# Patient Record
Sex: Male | Born: 1975 | Race: Black or African American | Hispanic: No | Marital: Single | Smoking: Current every day smoker
Health system: Southern US, Community
[De-identification: ages and names within clinical notes are randomized; demographics above are authoritative.]

## PROBLEM LIST (undated history)

## (undated) DIAGNOSIS — F209 Schizophrenia, unspecified: Secondary | ICD-10-CM

## (undated) DIAGNOSIS — F319 Bipolar disorder, unspecified: Secondary | ICD-10-CM

## (undated) HISTORY — PX: PARTIAL HIP ARTHROPLASTY: SHX733

## (undated) HISTORY — PX: APPENDECTOMY: SHX54

---

## 1999-04-16 ENCOUNTER — Inpatient Hospital Stay (HOSPITAL_COMMUNITY): Admission: EM | Admit: 1999-04-16 | Discharge: 1999-04-18 | Payer: Self-pay | Admitting: Emergency Medicine

## 1999-04-16 ENCOUNTER — Encounter: Payer: Self-pay | Admitting: Emergency Medicine

## 1999-09-09 ENCOUNTER — Emergency Department (HOSPITAL_COMMUNITY): Admission: EM | Admit: 1999-09-09 | Discharge: 1999-09-09 | Payer: Self-pay | Admitting: *Deleted

## 1999-09-11 ENCOUNTER — Emergency Department (HOSPITAL_COMMUNITY): Admission: EM | Admit: 1999-09-11 | Discharge: 1999-09-11 | Payer: Self-pay | Admitting: Emergency Medicine

## 1999-09-17 ENCOUNTER — Emergency Department (HOSPITAL_COMMUNITY): Admission: EM | Admit: 1999-09-17 | Discharge: 1999-09-17 | Payer: Self-pay | Admitting: Emergency Medicine

## 2000-04-23 ENCOUNTER — Inpatient Hospital Stay (HOSPITAL_COMMUNITY): Admission: EM | Admit: 2000-04-23 | Discharge: 2000-04-28 | Payer: Self-pay | Admitting: Emergency Medicine

## 2000-04-23 ENCOUNTER — Encounter: Payer: Self-pay | Admitting: General Surgery

## 2001-04-29 ENCOUNTER — Encounter: Payer: Self-pay | Admitting: Emergency Medicine

## 2001-04-29 ENCOUNTER — Emergency Department (HOSPITAL_COMMUNITY): Admission: EM | Admit: 2001-04-29 | Discharge: 2001-04-29 | Payer: Self-pay | Admitting: Emergency Medicine

## 2002-01-11 ENCOUNTER — Emergency Department (HOSPITAL_COMMUNITY): Admission: EM | Admit: 2002-01-11 | Discharge: 2002-01-11 | Payer: Self-pay | Admitting: Emergency Medicine

## 2002-01-11 ENCOUNTER — Encounter: Payer: Self-pay | Admitting: Emergency Medicine

## 2002-06-25 ENCOUNTER — Encounter: Payer: Self-pay | Admitting: Internal Medicine

## 2002-06-25 ENCOUNTER — Ambulatory Visit (HOSPITAL_COMMUNITY): Admission: RE | Admit: 2002-06-25 | Discharge: 2002-06-25 | Payer: Self-pay | Admitting: Internal Medicine

## 2003-01-19 ENCOUNTER — Encounter: Payer: Self-pay | Admitting: Emergency Medicine

## 2003-01-19 ENCOUNTER — Emergency Department (HOSPITAL_COMMUNITY): Admission: AC | Admit: 2003-01-19 | Discharge: 2003-01-19 | Payer: Self-pay | Admitting: Emergency Medicine

## 2009-12-02 ENCOUNTER — Emergency Department (HOSPITAL_COMMUNITY): Admission: EM | Admit: 2009-12-02 | Discharge: 2009-12-02 | Payer: Self-pay | Admitting: Family Medicine

## 2009-12-06 ENCOUNTER — Emergency Department (HOSPITAL_COMMUNITY): Admission: EM | Admit: 2009-12-06 | Discharge: 2009-12-06 | Payer: Self-pay | Admitting: Family Medicine

## 2010-02-26 ENCOUNTER — Emergency Department (HOSPITAL_COMMUNITY): Admission: EM | Admit: 2010-02-26 | Discharge: 2010-02-27 | Payer: Self-pay | Admitting: Emergency Medicine

## 2010-06-16 ENCOUNTER — Emergency Department (HOSPITAL_COMMUNITY)
Admission: EM | Admit: 2010-06-16 | Discharge: 2010-06-16 | Payer: Self-pay | Source: Home / Self Care | Admitting: Family Medicine

## 2010-09-30 LAB — COMPREHENSIVE METABOLIC PANEL
ALT: 22 U/L (ref 0–53)
AST: 23 U/L (ref 0–37)
Albumin: 4.4 g/dL (ref 3.5–5.2)
CO2: 30 mEq/L (ref 19–32)
Calcium: 8.9 mg/dL (ref 8.4–10.5)
Chloride: 109 mEq/L (ref 96–112)
GFR calc Af Amer: >60 mL/min (ref >60)
GFR calc non Af Amer: >60 mL/min (ref >60)
Sodium: 145 mEq/L (ref 135–145)
Total Bilirubin: 0.2 mg/dL — ABNORMAL LOW (ref 0.3–1.2)

## 2010-09-30 LAB — CBC
HCT: 47.8 % (ref 39.0–52.0)
Hemoglobin: 16.1 g/dL (ref 13.0–17.0)
MCH: 30.8 pg (ref 26.0–34.0)
MCHC: 33.7 g/dL (ref 30.0–36.0)
MCV: 91.6 fL (ref 78.0–100.0)
Platelets: 296 10*3/uL (ref 150–400)
RBC: 5.22 MIL/uL (ref 4.22–5.81)
RDW: 16.6 % — ABNORMAL HIGH (ref 11.5–15.5)
WBC: 11 10*3/uL — ABNORMAL HIGH (ref 4.0–10.5)

## 2010-09-30 LAB — RAPID URINE DRUG SCREEN, HOSP PERFORMED
Amphetamines: NOT DETECTED
Barbiturates: NOT DETECTED
Benzodiazepines: NOT DETECTED
Cocaine: POSITIVE — AB
Opiates: NOT DETECTED
Tetrahydrocannabinol: NOT DETECTED

## 2010-09-30 LAB — DIFFERENTIAL
Eosinophils Absolute: 0 10*3/uL (ref 0.0–0.7)
Eosinophils Relative: 0 % (ref 0–5)
Lymphs Abs: 2.8 10*3/uL (ref 0.7–4.0)
Monocytes Absolute: 0.6 10*3/uL (ref 0.1–1.0)

## 2010-09-30 LAB — ETHANOL: Alcohol, Ethyl (B): 303 mg/dL — ABNORMAL HIGH (ref 0–10)

## 2010-10-03 LAB — CULTURE, ROUTINE-ABSCESS
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
## 2011-10-02 ENCOUNTER — Emergency Department (INDEPENDENT_AMBULATORY_CARE_PROVIDER_SITE_OTHER)
Admission: EM | Admit: 2011-10-02 | Discharge: 2011-10-02 | Disposition: A | Discharge status: Home / Self Care | Payer: Self-pay | Source: Home / Self Care | Attending: Family Medicine | Admitting: Family Medicine

## 2011-10-02 ENCOUNTER — Encounter (HOSPITAL_COMMUNITY): Payer: Self-pay

## 2011-10-02 DIAGNOSIS — S7011XA Contusion of right thigh, initial encounter: Secondary | ICD-10-CM

## 2011-10-02 DIAGNOSIS — S7010XA Contusion of unspecified thigh, initial encounter: Secondary | ICD-10-CM
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
## 2011-10-02 NOTE — Discharge Instructions (Signed)
Wear ace for comfort, use heat and stretch for sore tight thigh muscle, activity as tolerated, see orthopedist if further problems.
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
## 2012-05-15 ENCOUNTER — Emergency Department (HOSPITAL_COMMUNITY)
Admission: EM | Admit: 2012-05-15 | Discharge: 2012-05-16 | Disposition: A | Discharge status: Home / Self Care | Payer: Self-pay | Attending: Emergency Medicine | Admitting: Emergency Medicine

## 2012-05-15 ENCOUNTER — Encounter (HOSPITAL_COMMUNITY): Payer: Self-pay | Admitting: *Deleted

## 2012-05-15 DIAGNOSIS — F1910 Other psychoactive substance abuse, uncomplicated: Secondary | ICD-10-CM | POA: Insufficient documentation

## 2012-05-15 DIAGNOSIS — R45851 Suicidal ideations: Secondary | ICD-10-CM | POA: Insufficient documentation

## 2012-05-15 DIAGNOSIS — F17200 Nicotine dependence, unspecified, uncomplicated: Secondary | ICD-10-CM | POA: Insufficient documentation

## 2012-05-15 LAB — CBC WITH DIFFERENTIAL/PLATELET
Eosinophils Absolute: 0.1 10*3/uL (ref 0.0–0.7)
Hemoglobin: 15.1 g/dL (ref 13.0–17.0)
Lymphocytes Relative: 30 % (ref 12–46)
Lymphs Abs: 2.4 10*3/uL (ref 0.7–4.0)
MCH: 29.4 pg (ref 26.0–34.0)
Monocytes Relative: 7 % (ref 3–12)
Neutro Abs: 4.8 10*3/uL (ref 1.7–7.7)
Neutrophils Relative %: 61 % (ref 43–77)
Platelets: 324 10*3/uL (ref 150–400)
RBC: 5.13 MIL/uL (ref 4.22–5.81)
WBC: 7.8 10*3/uL (ref 4.0–10.5)

## 2012-05-15 LAB — COMPREHENSIVE METABOLIC PANEL
ALT: 65 U/L — ABNORMAL HIGH (ref 0–53)
Alkaline Phosphatase: 77 U/L (ref 39–117)
BUN: 9 mg/dL (ref 6–23)
CO2: 21 mEq/L (ref 19–32)
Chloride: 102 mEq/L (ref 96–112)
GFR calc Af Amer: 84 mL/min — ABNORMAL LOW (ref >90)
GFR calc non Af Amer: 73 mL/min — ABNORMAL LOW (ref >90)
Glucose, Bld: 104 mg/dL — ABNORMAL HIGH (ref 70–99)
Potassium: 3.7 mEq/L (ref 3.5–5.1)
Sodium: 138 mEq/L (ref 135–145)
Total Bilirubin: 0.2 mg/dL — ABNORMAL LOW (ref 0.3–1.2)
Total Protein: 7.9 g/dL (ref 6.0–8.3)

## 2012-05-15 LAB — URINALYSIS, ROUTINE W REFLEX MICROSCOPIC
Glucose, UA: NEGATIVE mg/dL
Ketones, ur: NEGATIVE mg/dL
Leukocytes, UA: NEGATIVE
Nitrite: NEGATIVE
Protein, ur: 30 mg/dL — AB
pH: 5 (ref 5.0–8.0)

## 2012-05-15 LAB — RAPID URINE DRUG SCREEN, HOSP PERFORMED
Amphetamines: NOT DETECTED
Opiates: NOT DETECTED

## 2012-05-15 LAB — URINE MICROSCOPIC-ADD ON

## 2012-05-15 LAB — ETHANOL: Alcohol, Ethyl (B): 210 mg/dL — ABNORMAL HIGH (ref 0–11)

## 2012-05-15 MED ORDER — LORAZEPAM 1 MG PO TABS
1.0000 mg | ORAL_TABLET | Freq: Three times a day (TID) | ORAL | Status: DC | PRN
  Administered 2012-05-15: 1 mg via ORAL
  Filled 2012-05-15: qty 1

## 2012-05-15 MED ORDER — ZIPRASIDONE MESYLATE 20 MG IM SOLR
10.0000 mg | Freq: Once | INTRAMUSCULAR | Status: AC
  Administered 2012-05-15: 10 mg via INTRAMUSCULAR
  Filled 2012-05-15: qty 20

## 2012-05-15 NOTE — ED Notes (Signed)
Attempted to draw patients blood. Patient stated "That ain't going down." Explained to patient that it was a doctor order. Patient still refused. This Clinical research associate suggested maybe I could come back later. Patient agreed to that.

## 2012-05-15 NOTE — ED Notes (Signed)
Resting in bed and watching TV on approach. Appears flat and depressed, somewhat anxious. States he no longer feels suicidal and wants to leave. Explained he would have to talk to the psychiatrist first and let him determine the plan of care. Did request prn for anxiety and a prn was provided. Denies HI/AVH and contracts for safety. Offered no additional questions or concerns.
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
## 2012-05-15 NOTE — BH Assessment (Signed)
Assessment Note   Antonio Floyd is an 36 y.o. male who presents to [facility] voluntarily stating he feels like he is "about to break." He states he had a recent falling out with his uncle and mother over his grandmother's house. He states he has been living in her home but was recently kicked out by his uncle who now owns the home. He states he went to his mother for support but she refused to help him. He reports he has additional finical and occupational related stressors at this time. He states that he believes he is having depressive symptoms including irritability and anger. He states when he becomes depressed he wants to hurt other people. He denies any HI intent, plan, or intended victim at this time. He denies SI and [facility].   He reports using alcohol "a few" times a week and states he drinks "a lot" when he drinks. He reports last use was this morning. Pt also reports using cocaine infrequently, stating he will not actively seek it out or pay for it but will use when someone offers him some. He also reports using a quarter gram of THC daily. Pt states he can contract for safety and would like resources for psychiatrists.  A telepsych has been ordered to assist with disposition.      Axis I: Mood Disorder NOS and Substance Abuse Axis II: Deferred Axis III: History reviewed. No pertinent past medical history. Axis IV: housing problems, other psychosocial or environmental problems and problems related to social environment Axis V: 31-40 impairment in reality testing  Past Medical History: History reviewed. No pertinent past medical history.  History reviewed. No pertinent past surgical history.  Family History: No family history on file.  Social History:  reports that he has been smoking Cigarettes.  He has been smoking about .5 packs per day. He does not have any smokeless tobacco history on file. He reports that he drinks alcohol. He reports that he uses illicit drugs  (Marijuana).  Additional Social History:  Alcohol / Drug Use History of alcohol / drug use?: Yes Substance #1 Name of Substance 1: Alcohol 1 - Age of First Use: teens 1 - Amount (size/oz): states he drinks "a lot" when he drinks 1 - Frequency: 2-3 times weekly 1 - Duration: 3 months, on and off since 20s  1 - Last Use / Amount: 05/15/12 Substance #2 Name of Substance 2: cocaine 2 - Age of First Use: teens 2 - Amount (size/oz): varries, states he uses when someone offers him some but will not actively seek it or pay for it 2 - Last Use / Amount: 05/14/12 Substance #3 Name of Substance 3: THC 3 - Age of First Use: 16 3 - Amount (size/oz): a quater of a gram 3 - Frequency: daily 3 - Duration: since age 53 3 - Last Use / Amount: 05/15/12  CIWA: CIWA-Ar BP: 151/85 mmHg Pulse Rate: 85  COWS:    Allergies: No Known Allergies  Home Medications:  (Not in a hospital admission)  OB/GYN Status:  No LMP for male patient.  General Assessment Data Location of Assessment: [facility] Living Arrangements: Alone Can pt return to current living arrangement?: Yes Admission Status: Voluntary Is patient capable of signing voluntary admission?: Yes Transfer from: Acute Hospital Referral Source: Self/Family/Friend  Education Status Is patient currently in school?: No  Risk to self Suicidal Ideation: No Suicidal Intent: No Is patient at risk for suicide?: No Suicidal Plan?: No Access to Means: No What  has been your use of drugs/alcohol within the last 12 months?: THC, Cocaine, and alcohol Previous Attempts/Gestures: Yes How many times?: 1  Other Self Harm Risks: drug use Triggers for Past Attempts: Other personal contacts;Family contact Intentional Self Injurious Behavior: None Family Suicide History: No Recent stressful life event(s): Conflict (Comment) (conflict with family  ) Persecutory voices/beliefs?: No Depression: Yes Depression Symptoms: Feeling angry/irritable Substance  abuse history and/or treatment for substance abuse?: Yes Suicide prevention information given to non-admitted patients: Not applicable  Risk to Others Homicidal Ideation: No-Not Currently/Within Last 6 Months Thoughts of Harm to Others: No-Not Currently Present/Within Last 6 Months Current Homicidal Intent: No Current Homicidal Plan: No Access to Homicidal Means: No Identified Victim: none History of harm to others?: Yes Assessment of Violence: None Noted Violent Behavior Description: cooperative Does patient have access to weapons?: No Criminal Charges Pending?: No Does patient have a court date: No  Psychosis Hallucinations: None noted Delusions: None noted  Mental Status Report Appear/Hygiene: Disheveled Eye Contact: Fair Motor Activity: Unremarkable Speech: Logical/coherent Level of Consciousness: Alert Mood: Irritable Affect: Irritable Anxiety Level: None Thought Processes: Coherent;Relevant Judgement: Impaired Orientation: Person;Time;Place;Situation Obsessive Compulsive Thoughts/Behaviors: None  Cognitive Functioning Concentration: Normal Memory: Recent Intact;Remote Intact IQ: Average Insight: Poor Impulse Control: Poor Appetite: Fair Weight Loss: 0  Weight Gain: 0  Sleep: No Change Vegetative Symptoms: None  ADLScreening [facility] Assessment Services) Patient's cognitive ability adequate to safely complete daily activities?: Yes Patient able to express need for assistance with ADLs?: Yes Independently performs ADLs?: Yes (appropriate for developmental age)  Abuse/Neglect [facility]) Physical Abuse: Denies Verbal Abuse: Yes, past (Comment) Sexual Abuse: Denies  Prior Inpatient Therapy Prior Inpatient Therapy: No  Prior Outpatient Therapy Prior Outpatient Therapy: No Prior Therapy Dates: na Prior Therapy Facilty/Provider(s): na Reason for Treatment: na  ADL Screening (condition at time of admission) Patient's cognitive ability adequate to safely complete  daily activities?: Yes Patient able to express need for assistance with ADLs?: Yes Independently performs ADLs?: Yes (appropriate for developmental age) Weakness of Legs: None Weakness of Arms/Hands: None  Home Assistive Devices/Equipment Home Assistive Devices/Equipment: None    Abuse/Neglect Assessment (Assessment to be complete while patient is alone) Physical Abuse: Denies Verbal Abuse: Yes, past (Comment) Sexual Abuse: Denies Exploitation of patient/patient's resources: Denies Self-Neglect: Denies Values / Beliefs Cultural Requests During Hospitalization: None Spiritual Requests During Hospitalization: None   Advance Directives (For Healthcare) Advance Directive: Patient does not have advance directive;Patient would not like information Pre-existing out of facility DNR order (yellow form or pink MOST form): No Nutrition Screen- [facility] Patient's home diet: Regular Have you recently lost weight without trying?: No Have you been eating poorly because of a decreased appetite?: No Malnutrition Screening Tool Score: 0   Additional Information 1:1 In Past 12 Months?: No CIRT Risk: No Elopement Risk: No Does patient have medical clearance?: Yes     Disposition:  Disposition Disposition of Patient: Other dispositions (Pending telepsych)  On Site Evaluation by:   Reviewed with Physician:     Georgina Quint A 05/15/2012 9:07 PM

## 2012-05-16 NOTE — ED Provider Notes (Signed)
Patient was evaluated by psychiatry. His consultation note was reviewed. Recommending discharge. Patient is now calm and cooperative. He has no evidence of psychosis. Initial aggression likely secondary to cocaine ingestion. The patient is psychiatrically stable for discharge at this time. Outpatient followup.  Raeford Razor, MD 05/16/12 [phone]

## 2012-08-03 ENCOUNTER — Encounter (HOSPITAL_COMMUNITY): Payer: Self-pay | Admitting: Emergency Medicine

## 2012-08-03 ENCOUNTER — Emergency Department (HOSPITAL_COMMUNITY)
Admission: EM | Admit: 2012-08-03 | Discharge: 2012-08-04 | Disposition: A | Discharge status: Home / Self Care | Payer: Self-pay | Attending: Emergency Medicine | Admitting: Emergency Medicine

## 2012-08-03 DIAGNOSIS — F39 Unspecified mood [affective] disorder: Secondary | ICD-10-CM | POA: Insufficient documentation

## 2012-08-03 DIAGNOSIS — F411 Generalized anxiety disorder: Secondary | ICD-10-CM | POA: Insufficient documentation

## 2012-08-03 DIAGNOSIS — F209 Schizophrenia, unspecified: Secondary | ICD-10-CM | POA: Insufficient documentation

## 2012-08-03 DIAGNOSIS — F1910 Other psychoactive substance abuse, uncomplicated: Secondary | ICD-10-CM | POA: Insufficient documentation

## 2012-08-03 DIAGNOSIS — F602 Antisocial personality disorder: Secondary | ICD-10-CM | POA: Insufficient documentation

## 2012-08-03 DIAGNOSIS — F17200 Nicotine dependence, unspecified, uncomplicated: Secondary | ICD-10-CM | POA: Insufficient documentation

## 2012-08-03 DIAGNOSIS — F1210 Cannabis abuse, uncomplicated: Secondary | ICD-10-CM | POA: Insufficient documentation

## 2012-08-03 DIAGNOSIS — F319 Bipolar disorder, unspecified: Secondary | ICD-10-CM | POA: Insufficient documentation

## 2012-08-03 DIAGNOSIS — R45851 Suicidal ideations: Secondary | ICD-10-CM | POA: Insufficient documentation

## 2012-08-03 LAB — COMPREHENSIVE METABOLIC PANEL
ALT: 20 U/L (ref 0–53)
AST: 24 U/L (ref 0–37)
Albumin: 4.1 g/dL (ref 3.5–5.2)
Calcium: 9.6 mg/dL (ref 8.4–10.5)
Creatinine, Ser: 1.09 mg/dL (ref 0.50–1.35)
Sodium: 145 mEq/L (ref 135–145)

## 2012-08-03 LAB — CBC
MCH: 30.9 pg (ref 26.0–34.0)
MCV: 89.2 fL (ref 78.0–100.0)
Platelets: 337 10*3/uL (ref 150–400)
RBC: 4.92 MIL/uL (ref 4.22–5.81)
RDW: 15.2 % (ref 11.5–15.5)
WBC: 11.2 10*3/uL — ABNORMAL HIGH (ref 4.0–10.5)

## 2012-08-03 LAB — RAPID URINE DRUG SCREEN, HOSP PERFORMED
Amphetamines: NOT DETECTED
Barbiturates: NOT DETECTED
Benzodiazepines: POSITIVE — AB
Cocaine: NOT DETECTED
Opiates: NOT DETECTED
Tetrahydrocannabinol: POSITIVE — AB

## 2012-08-03 MED ORDER — NICOTINE 21 MG/24HR TD PT24: 21.0000 mg | MEDICATED_PATCH | Freq: Every day | TRANSDERMAL | Status: DC

## 2012-08-03 MED ORDER — ZOLPIDEM TARTRATE 5 MG PO TABS: 5.0000 mg | ORAL_TABLET | Freq: Every evening | ORAL | Status: DC | PRN

## 2012-08-03 MED ORDER — ONDANSETRON HCL 4 MG PO TABS: 4.0000 mg | ORAL_TABLET | Freq: Three times a day (TID) | ORAL | Status: DC | PRN

## 2012-08-03 MED ORDER — ACETAMINOPHEN 325 MG PO TABS: 650.0000 mg | ORAL_TABLET | ORAL | Status: DC | PRN

## 2012-08-03 MED ORDER — IBUPROFEN 600 MG PO TABS: 600.0000 mg | ORAL_TABLET | Freq: Three times a day (TID) | ORAL | Status: DC | PRN

## 2012-08-03 NOTE — ED Provider Notes (Signed)
Medical screening examination/treatment/procedure(s) were performed by non-physician practitioner and as supervising physician I was immediately available for consultation/collaboration.  Leandrew Keech R. Gerritt Galentine, MD 08/03/12 2328 

## 2012-08-03 NOTE — ED Notes (Signed)
[employer] sts pt called and sts he was about to hurt himself and others. Pt sts he has been seen for same thing in past.

## 2012-08-03 NOTE — ED Provider Notes (Signed)
History  Scribed for Antonio Pel, PA-C/Nathan R. Rubin Payor, MD, the patient was seen in room [facility]TR1/[facility]. This chart was scribed by Candelaria Stagers. The patient's care started at 10:40 PM   CSN: [id number]  Arrival date & time 08/03/12  2219   First MD Initiated Contact with Patient 08/03/12 2238      Chief Complaint  Patient presents with  . Medical Clearance     The history is provided by the patient and the police. The history is limited by the condition of the patient. No language interpreter was used.   Pt status: currently voluntary  Takumi GAYLON BENTZ is a 37 y.o. male who presents to the Emergency Department after calling the police earlier today because he was worried about hurting himself or someone else.  Pt has been seen for the same thing in the past.  H/o bipolar and schizophrenia.   Pt is not cooperative and will not answer questions. He is very anxious. He becomes very suspicious when offered medication to help him calm down.  History reviewed. No pertinent past medical history.  History reviewed. No pertinent past surgical history.  No family history on file.  History  Substance Use Topics  . Smoking status: Current Every Day Smoker -- 0.5 packs/day    Types: Cigarettes  . Smokeless tobacco: Not on file  . Alcohol Use: Yes      Review of Systems  Unable to perform ROS: Other  Psychiatric/Behavioral: Positive for suicidal ideas.  pt uncooperative and anxious  Allergies  Review of patient's allergies indicates no known allergies.  Home Medications  No current outpatient prescriptions on file.  BP 146/96  Pulse 104  Temp 98.3 F (36.8 C) (Oral)  Resp 20  SpO2 97%  Physical Exam  Nursing note and vitals reviewed. Constitutional: He is oriented to person, place, and time. He appears well-developed and well-nourished. No distress.  HENT:  Head: Normocephalic and atraumatic.  Eyes: EOM are normal.  Neck: Neck supple. No tracheal deviation  present.  Cardiovascular: Normal rate.   Pulmonary/Chest: Effort normal. No respiratory distress.  Musculoskeletal: Normal range of motion.  Neurological: He is alert and oriented to person, place, and time.  Skin: Skin is warm and dry.  Psychiatric: His mood appears anxious. His affect is angry. He expresses suicidal ideation.       Vague about homicidal ideation Will not disclose if he has plan    ED Course  Procedures  DIAGNOSTIC STUDIES: Oxygen Saturation is 97% on room air, normal by my interpretation.    COORDINATION OF CARE:   10:21 PM Ordered: CBC; Comprehensive metabolic panel; Ethanol (ETOH); Urine Drug Screen 10:46 PM Consult to call ACT team Labs Reviewed  CBC - Abnormal; Notable for the following:    WBC 11.2 (*)     All other components within normal limits  COMPREHENSIVE METABOLIC PANEL  ETHANOL  URINE RAPID DRUG SCREEN (HOSP PERFORMED)   No results found.   1. Suicidal ideation   2. Schizophrenia   3. Bipolar disorder       MDM  ACT consulted. Holding orders placed. Med red done  Pt was becoming increasingly anxious but calmed down after police officers left without sedation. He is voluntary. If he continues to endorse SI he would need to be IVC'd.   I personally performed the services described in this documentation, which was scribed in my presence. The recorded information has been reviewed and is accurate.        Rashan Rounsaville G  Neva Seat, [place] 08/03/12 2309

## 2012-08-04 NOTE — BH Assessment (Signed)
Assessment Note   Antonio Floyd is an 37 y.o. male.Pt presents voluntarily to [facility] via [employer]. Pt sts he called [employer] b/c "I felt like I was going to spazz out". Pt refuses to define what that term means to him. Pt denies wanting to harm himself. Pt won't answer when asked re: HI or wanting to harm another person. Per ED notes, pt told RN that he wanted to kill someone. Pt vague and doesn't answer several questions. Pt's BAL was 155 upon admission. Pt lives with his mother. When writer asked pt re: his mood, pt says "I don't know". Pt answers "I don't know" to several questions and refused to elaborate. His affect is irritable and he is drowsy. He states he drank "one pint of alcohol" 08/03/12.  Pt denies [facility] and no delusions noted. Pt denies prior inpatient or outpatient MH treatment. Pt's disposition is pending telepsych consult. Per pt's PTA meds list, he takes Seroquel.  Axis I: Mood Disorder NOS Axis II: Deferred Axis III: History reviewed. No pertinent past medical history. Axis IV: other psychosocial or environmental problems and problems related to social environment Axis V: 31-40 impairment in reality testing  Past Medical History: History reviewed. No pertinent past medical history.  History reviewed. No pertinent past surgical history.  Family History: No family history on file.  Social History:  reports that he has been smoking Cigarettes.  He has been smoking about .5 packs per day. He does not have any smokeless tobacco history on file. He reports that he drinks alcohol. He reports that he uses illicit drugs (Marijuana).  Additional Social History:  Alcohol / Drug Use Pain Medications: none Prescriptions: see PTA meds Over the Counter: none History of alcohol / drug use?: Yes Substance #1 Name of Substance 1: alcohol 1 - Age of First Use: teens 1 - Amount (size/oz): varies 1 - Frequency: 2-3 times weekly 1 - Duration: on and off since 20s 1 - Last Use / Amount: 08/03/12 -  one pint Substance #2 Name of Substance 2: THC 2 - Age of First Use: 16 2 - Amount (size/oz): one quarter of a gram 2 - Frequency: daily 2 - Duration: since age 71 2 - Last Use / Amount: "the other day" - one blunt  CIWA: CIWA-Ar BP: 127/77 mmHg Pulse Rate: 92  Nausea and Vomiting: no nausea and no vomiting Tactile Disturbances: none Tremor: no tremor Auditory Disturbances: not present Paroxysmal Sweats: no sweat visible Visual Disturbances: not present Anxiety: two Headache, Fullness in Head: none present Agitation: normal activity Orientation and Clouding of Sensorium: oriented and can do serial additions CIWA-Ar Total: 2  COWS:    Allergies: No Known Allergies  Home Medications:  (Not in a hospital admission)  OB/GYN Status:  No LMP for male patient.  General Assessment Data [facility] of Assessment: [facility] Living Arrangements: Parent Can pt return to current living arrangement?: Yes Admission Status: Voluntary Is patient capable of signing voluntary admission?: Yes Transfer from: Acute Hospital Referral Source: Self/Family/Friend ([employer])  Education Status Is patient currently in school?: No Current Grade: none Highest grade of school patient has completed:  (pt says he doesn't know highest grade attended)  Risk to self Suicidal Ideation: No Suicidal Intent: No Is patient at risk for suicide?: No Suicidal Plan?: No Access to Means: No What has been your use of drugs/alcohol within the last 12 months?: frequent alcohol and thc use Previous Attempts/Gestures: Yes How many times?: 1  (3 yrs ago tried to cut  his neck) Other Self Harm Risks: none Triggers for Past Attempts: Family contact Intentional Self Injurious Behavior: None Family Suicide History: Unable to assess Recent stressful life event(s):  (pt denies stressors) Persecutory voices/beliefs?: No Depression: No Substance abuse history and/or treatment for substance abuse?: No Suicide prevention  information given to non-admitted patients: Not applicable  Risk to Others Homicidal Ideation:  (pt wont answer) Thoughts of Harm to Others:  (pt won't answer but told RN earlier that he wanted to harm ) Current Homicidal Intent:  (unable to assess as pt won't answer) Current Homicidal Plan:  (unable to assess as pt won't answer) Access to Homicidal Means:  (unable to assess) Identified Victim:  (unable to assess) History of harm to others?: Yes Assessment of Violence: In distant past Violent Behavior Description: 3 yrs ago pt arrested for attempted murder Does patient have access to weapons?: No (pt denies) Criminal Charges Pending?: No Does patient have a court date: No  Psychosis Hallucinations: None noted (pt denies) Delusions: None noted  Mental Status Report Appear/Hygiene: Other (Comment) (unremarkable) Eye Contact: Fair Motor Activity: Freedom of movement Speech: Logical/coherent Level of Consciousness: Drowsy Mood: Other (Comment) (pt won't answer question) Affect: Irritable Anxiety Level: Minimal Thought Processes: Coherent;Relevant Judgement: Impaired Orientation: Person;Situation;Time;Place Obsessive Compulsive Thoughts/Behaviors:  (unable to assess)  Cognitive Functioning Concentration: Normal Memory:  (unable to assess) IQ: Average Insight: Poor Impulse Control: Fair Appetite:  (unable to assess) Sleep:  (unable to assess) Vegetative Symptoms: None  ADLScreening [facility] Assessment Services) Patient's cognitive ability adequate to safely complete daily activities?: Yes Patient able to express need for assistance with ADLs?: Yes Independently performs ADLs?: Yes (appropriate for developmental age)  Abuse/Neglect [facility]) Physical Abuse:  (unable to assess) Verbal Abuse:  (unable to assess) Sexual Abuse:  (unable to assess)  Prior Inpatient Therapy Prior Inpatient Therapy: No Prior Therapy Dates: na Prior Therapy Facilty/Provider(s): na Reason for  Treatment: na  Prior Outpatient Therapy Prior Outpatient Therapy: No (pt denies) Prior Therapy Dates: na Prior Therapy Facilty/Provider(s): na Reason for Treatment: na  ADL Screening (condition at time of admission) Patient's cognitive ability adequate to safely complete daily activities?: Yes Patient able to express need for assistance with ADLs?: Yes Independently performs ADLs?: Yes (appropriate for developmental age) Weakness of Legs: None Weakness of Arms/Hands: None       Abuse/Neglect Assessment (Assessment to be complete while patient is alone) Physical Abuse:  (unable to assess) Verbal Abuse:  (unable to assess) Sexual Abuse:  (unable to assess) Exploitation of patient/patient's resources:  (unable to assess) Self-Neglect:  (unable to assess) Values / Beliefs Cultural Requests During Hospitalization: None Spiritual Requests During Hospitalization: None   Advance Directives (For Healthcare) Advance Directive: Patient does not have advance directive    Additional Information 1:1 In Past 12 Months?:  (unable to assess) CIRT Risk: No Elopement Risk: No Does patient have medical clearance?: Yes     Disposition:  Disposition Disposition of Patient:  (pending telepsych)  On Site Evaluation by:   Reviewed with Physician:     Donnamarie Rossetti P 08/04/2012 2:56 AM

## 2012-08-04 NOTE — ED Notes (Signed)
Pt c/o SI/HI. Denies visual hallucinations, but reports slight auditory hallucinations. Pt reports previous SI attempt 3 years prior via cutting his neck. Pt reports HI attempt 2 years prior with an attempted murder charge. Pt denies SI right now, but reports he "will" hurt someone. Denies any particular individual.

## 2012-08-04 NOTE — ED Notes (Signed)
On call specialist contacted.

## 2012-08-04 NOTE — ED Provider Notes (Signed)
Patient has been seen by telepsych, Dr. Trisha Mangle. He feels that he is okay for discharge. He recommends reversal of IVC. Patient to followup with outpatient mental health clinics and substance abuse clinics.  Olivia Mackie, MD 08/04/12 [phone]

## 2012-08-04 NOTE — ED Notes (Signed)
Act team left bedside. 

## 2012-08-04 NOTE — ED Notes (Signed)
TelePsych consult completed 

## 2013-05-22 ENCOUNTER — Emergency Department (HOSPITAL_COMMUNITY)
Admission: EM | Admit: 2013-05-22 | Discharge: 2013-05-22 | Disposition: A | Discharge status: Home / Self Care | Payer: Self-pay | Attending: Emergency Medicine | Admitting: Emergency Medicine

## 2013-05-22 ENCOUNTER — Encounter (HOSPITAL_COMMUNITY): Payer: Self-pay | Admitting: Emergency Medicine

## 2013-05-22 DIAGNOSIS — F319 Bipolar disorder, unspecified: Secondary | ICD-10-CM | POA: Diagnosis present

## 2013-05-22 DIAGNOSIS — F209 Schizophrenia, unspecified: Secondary | ICD-10-CM | POA: Insufficient documentation

## 2013-05-22 DIAGNOSIS — Z79899 Other long term (current) drug therapy: Secondary | ICD-10-CM | POA: Insufficient documentation

## 2013-05-22 DIAGNOSIS — F17200 Nicotine dependence, unspecified, uncomplicated: Secondary | ICD-10-CM | POA: Insufficient documentation

## 2013-05-22 HISTORY — DX: Schizophrenia, unspecified: F20.9

## 2013-05-22 HISTORY — DX: Bipolar disorder, unspecified: F31.9

## 2013-05-22 LAB — ETHANOL: Alcohol, Ethyl (B): 247 mg/dL — ABNORMAL HIGH (ref 0–11)

## 2013-05-22 LAB — CBC WITH DIFFERENTIAL/PLATELET
Basophils Relative: 1 % (ref 0–1)
Eosinophils Absolute: 0.1 10*3/uL (ref 0.0–0.7)
Eosinophils Relative: 1 % (ref 0–5)
MCH: 30.3 pg (ref 26.0–34.0)
MCHC: 35 g/dL (ref 30.0–36.0)
Monocytes Relative: 7 % (ref 3–12)
Neutrophils Relative %: 70 % (ref 43–77)
Platelets: 340 10*3/uL (ref 150–400)

## 2013-05-22 LAB — BASIC METABOLIC PANEL
BUN: 7 mg/dL (ref 6–23)
Calcium: 9.3 mg/dL (ref 8.4–10.5)
GFR calc Af Amer: >90 mL/min (ref >90)
GFR calc non Af Amer: 88 mL/min — ABNORMAL LOW (ref >90)
Potassium: 3.7 mEq/L (ref 3.5–5.1)
Sodium: 143 mEq/L (ref 135–145)

## 2013-05-22 LAB — RAPID URINE DRUG SCREEN, HOSP PERFORMED: Benzodiazepines: NOT DETECTED

## 2013-05-22 NOTE — ED Notes (Signed)
Resting quietly, nad, when asked if he was still having thoughts of hurting people pt reported that he was not, and that he "wasn't going to hurt nobody."

## 2013-05-22 NOTE — ED Provider Notes (Signed)
CSN: [id number]     Arrival date & time 05/22/13  4098 History   First MD Initiated Contact with Patient 05/22/13 0331     Chief Complaint  Patient presents with  . Medical Clearance   (Consider location/radiation/quality/duration/timing/severity/associated sxs/prior Treatment) The history is provided by the patient.  pt with hx schizophrenia, bipolar disorder, states 'was tripping' tonight, states had used etoh and thc, was talking about killing people, talking our of his head. Girlfriend became concerned that he may hurt himself, called mobile crisis, who contacted gpd, who brought to ED.  Pt states is a patient at [facility]. States compliant w normal meds. Denies any recent health problems.  Is evasive to many questions about what he was thinking and/or doing that prompted his arrival to ED - just keeps stating 'I was tripping man'. Currently denies any thoughts of harm to self and/or others.      Past Medical History  Diagnosis Date  . Bipolar 1 disorder   . Schizophrenia    Past Surgical History  Procedure Laterality Date  . Partial hip arthroplasty    . Appendectomy     No family history on file. History  Substance Use Topics  . Smoking status: Current Every Day Smoker -- 0.50 packs/day    Types: Cigarettes  . Smokeless tobacco: Not on file  . Alcohol Use: Yes    Review of Systems  Constitutional: Negative for fever.  HENT: Negative for rhinorrhea.   Eyes: Negative for redness.  Respiratory: Negative for shortness of breath.   Cardiovascular: Negative for chest pain.  Gastrointestinal: Negative for abdominal pain.  Genitourinary: Negative for flank pain.  Musculoskeletal: Negative for back pain and neck pain.  Skin: Negative for rash.  Neurological: Negative for headaches.  Hematological: Does not bruise/bleed easily.  Psychiatric/Behavioral: Positive for dysphoric mood and agitation.    Allergies  Review of patient's allergies indicates no known  allergies.  Home Medications   Current Outpatient Rx  Name  Route  Sig  Dispense  Refill  . QUEtiapine (SEROQUEL) 100 MG tablet   Oral   Take 300 mg by mouth at bedtime.           There were no vitals taken for this visit. Physical Exam  Nursing note and vitals reviewed. Constitutional: He is oriented to person, place, and time. He appears well-developed and well-nourished. No distress.  HENT:  Head: Atraumatic.  Eyes: Conjunctivae are normal. Pupils are equal, round, and reactive to light.  Neck: Neck supple. No tracheal deviation present.  Cardiovascular: Normal rate, normal heart sounds and intact distal pulses.   Pulmonary/Chest: Effort normal and breath sounds normal. No accessory muscle usage. No respiratory distress.  Abdominal: He exhibits no distension. There is no tenderness.  Musculoskeletal: Normal range of motion. He exhibits no edema.  Neurological: He is alert and oriented to person, place, and time.  Steady gait.   Skin: Skin is warm and dry. He is not diaphoretic.  Psychiatric: He has a normal mood and affect.  Pt not cooperative w some/many questions but his speech is clear/fluent. Thought processes appear reasonably clear/organized. No hallucinations or delusional thoughts. Denies SI/HI.     ED Course  Procedures (including critical care time)   Results for orders placed during the hospital encounter of 05/22/13  CBC WITH DIFFERENTIAL      Result Value Range   WBC 14.4 (*) 4.0 - 10.5 K/uL   RBC 4.92  4.22 - 5.81 MIL/uL   Hemoglobin 14.9  13.0 -  17.0 g/dL   HCT 78.2  95.6 - 21.3 %   MCV 86.6  78.0 - 100.0 fL   MCH 30.3  26.0 - 34.0 pg   MCHC 35.0  30.0 - 36.0 g/dL   RDW 08.6  57.8 - 46.9 %   Platelets 340  150 - 400 K/uL   Neutrophils Relative % 70  43 - 77 %   Neutro Abs 10.1 (*) 1.7 - 7.7 K/uL   Lymphocytes Relative 22  12 - 46 %   Lymphs Abs 3.2  0.7 - 4.0 K/uL   Monocytes Relative 7  3 - 12 %   Monocytes Absolute 1.0  0.1 - 1.0 K/uL    Eosinophils Relative 1  0 - 5 %   Eosinophils Absolute 0.1  0.0 - 0.7 K/uL   Basophils Relative 1  0 - 1 %   Basophils Absolute 0.1  0.0 - 0.1 K/uL  BASIC METABOLIC PANEL      Result Value Range   Sodium 143  135 - 145 mEq/L   Potassium 3.7  3.5 - 5.1 mEq/L   Chloride 108  96 - 112 mEq/L   CO2 23  19 - 32 mEq/L   Glucose, Bld 97  70 - 99 mg/dL   BUN 7  6 - 23 mg/dL   Creatinine, Ser 6.29  0.50 - 1.35 mg/dL   Calcium 9.3  8.4 - 52.8 mg/dL   GFR calc non Af Amer 88 (*) >90 mL/min   GFR calc Af Amer >90  >90 mL/min  ETHANOL      Result Value Range   Alcohol, Ethyl (B) 247 (*) 0 - 11 mg/dL      MDM  Labs.   Psych team consult.  Reviewed nursing notes and prior charts for additional history.   Recheck pt calm and alert, awaiting psych team evaluation and dispo.     Suzi Roots, MD 05/22/13 [phone]

## 2013-05-22 NOTE — ED Notes (Signed)
Pt states he was brought to hospital because he was "tripping" and feeling down on himself; states woman friend was scared he was going to hurt himself; states previous suicide attempt with cutting  Throat; pt states "I wont do it"; pt denies drugs/etoh tonight;

## 2013-05-22 NOTE — ED Provider Notes (Signed)
1:04 PM Psych recommending d/c home. Will d/c.   Clinical Impression 1. Bipolar mood disorder      Junius Argyle, MD 05/22/13 [phone]

## 2013-05-22 NOTE — Consult Note (Signed)
[facility] Face-to-Face Psychiatry Consult   Reason for Consult:  Depressed with thoughts of hurting others Referring Physician:  ER MD  Antonio Floyd is an 37 y.o. male.  Assessment: AXIS I:  Mood Disorder NOS AXIS II:  Personality Disorder NOS AXIS III:   Past Medical History  Diagnosis Date  . Bipolar 1 disorder   . Schizophrenia    AXIS IV:  economic problems and occupational problems AXIS V:  51-60 moderate symptoms  Plan:  No evidence of imminent risk to self or others at present.    Subjective:   Antonio Floyd is a 36 y.o. male patient admitted with thoughts of hurting others.  HPI:  Antonio Floyd was angry but cooperative when we first talked to him and indicated he was hearing no voices, had no suicidal thoughts or plans to hurt anybody.  Says he has thoughts of hurting other people but he would not really hurt anybody.  He also had a blood alcohol level of 240 and by one pm when I talked to him again his mood had improved considerably and he was no longer irritable, saying he had no thoughts of hurting anyone and wanted to go home. HPI Elements:     Past Psychiatric History: Past Medical History  Diagnosis Date  . Bipolar 1 disorder   . Schizophrenia     reports that he has been smoking Cigarettes.  He has been smoking about 0.50 packs per day. He does not have any smokeless tobacco history on file. He reports that he drinks alcohol. He reports that he uses illicit drugs (Marijuana). No family history on file.         Allergies:  No Known Allergies  ACT Assessment Complete:  Yes:    Educational Status    Risk to Self: Risk to self Is patient at risk for suicide?: No Substance abuse history and/or treatment for substance abuse?: Yes  Risk to Others:    Abuse:    Prior Inpatient Therapy:    Prior Outpatient Therapy:    Additional Information:                    Objective: Blood pressure 161/96, pulse 64, temperature 97.2 F (36.2 C),  temperature source Oral, resp. rate 17, height 5\' 9"  (1.753 m), weight 81.647 kg (180 lb), SpO2 98.00%.Body mass index is 26.57 kg/(m^2). Results for orders placed during the hospital encounter of 05/22/13 (from the past 72 hour(s))  CBC WITH DIFFERENTIAL     Status: Abnormal   Collection Time    05/22/13  3:42 AM      Result Value Range   WBC 14.4 (*) 4.0 - 10.5 K/uL   RBC 4.92  4.22 - 5.81 MIL/uL   Hemoglobin 14.9  13.0 - 17.0 g/dL   HCT 16.1  09.6 - 04.5 %   MCV 86.6  78.0 - 100.0 fL   MCH 30.3  26.0 - 34.0 pg   MCHC 35.0  30.0 - 36.0 g/dL   RDW 40.9  81.1 - 91.4 %   Platelets 340  150 - 400 K/uL   Neutrophils Relative % 70  43 - 77 %   Neutro Abs 10.1 (*) 1.7 - 7.7 K/uL   Lymphocytes Relative 22  12 - 46 %   Lymphs Abs 3.2  0.7 - 4.0 K/uL   Monocytes Relative 7  3 - 12 %   Monocytes Absolute 1.0  0.1 - 1.0 K/uL   Eosinophils Relative 1  0 - 5 %   Eosinophils Absolute 0.1  0.0 - 0.7 K/uL   Basophils Relative 1  0 - 1 %   Basophils Absolute 0.1  0.0 - 0.1 K/uL  BASIC METABOLIC PANEL     Status: Abnormal   Collection Time    05/22/13  3:42 AM      Result Value Range   Sodium 143  135 - 145 mEq/L   Potassium 3.7  3.5 - 5.1 mEq/L   Chloride 108  96 - 112 mEq/L   CO2 23  19 - 32 mEq/L   Glucose, Bld 97  70 - 99 mg/dL   BUN 7  6 - 23 mg/dL   Creatinine, Ser 3.08  0.50 - 1.35 mg/dL   Calcium 9.3  8.4 - 65.7 mg/dL   GFR calc non Af Amer 88 (*) >90 mL/min   GFR calc Af Amer >90  >90 mL/min   Comment: (NOTE)     The eGFR has been calculated using the CKD EPI equation.     This calculation has not been validated in all clinical situations.     eGFR's persistently <90 mL/min signify possible Chronic Kidney     Disease.  ETHANOL     Status: Abnormal   Collection Time    05/22/13  3:42 AM      Result Value Range   Alcohol, Ethyl (B) 247 (*) 0 - 11 mg/dL   Comment:            LOWEST DETECTABLE LIMIT FOR     SERUM ALCOHOL IS 11 mg/dL     FOR MEDICAL PURPOSES ONLY  URINE RAPID  DRUG SCREEN (HOSP PERFORMED)     Status: Abnormal   Collection Time    05/22/13  8:37 AM      Result Value Range   Opiates NONE DETECTED  NONE DETECTED   Cocaine NONE DETECTED  NONE DETECTED   Benzodiazepines NONE DETECTED  NONE DETECTED   Amphetamines NONE DETECTED  NONE DETECTED   Tetrahydrocannabinol POSITIVE (*) NONE DETECTED   Barbiturates NONE DETECTED  NONE DETECTED   Comment:            DRUG SCREEN FOR MEDICAL PURPOSES     ONLY.  IF CONFIRMATION IS NEEDED     FOR ANY PURPOSE, NOTIFY LAB     WITHIN 5 DAYS.                LOWEST DETECTABLE LIMITS     FOR URINE DRUG SCREEN     Drug Class       Cutoff (ng/mL)     Amphetamine      1000     Barbiturate      200     Benzodiazepine   200     Tricyclics       300     Opiates          300     Cocaine          300     THC              50   Labs are reviewed and are pertinent for no issue related to releasing him home.  No current facility-administered medications for this encounter.   Current Outpatient Prescriptions  Medication Sig Dispense Refill  . QUEtiapine (SEROQUEL) 100 MG tablet Take 300 mg by mouth at bedtime.         Psychiatric Specialty Exam:     Blood  pressure 161/96, pulse 64, temperature 97.2 F (36.2 C), temperature source Oral, resp. rate 17, height 5\' 9"  (1.753 m), weight 81.647 kg (180 lb), SpO2 98.00%.Body mass index is 26.57 kg/(m^2).  General Appearance: Fairly Groomed  Patent attorney::  Good  Speech:  Clear and Coherent and Normal Rate  Volume:  normal  Mood:  Depressed  Affect:  Appropriate  Thought Process:  Coherent and Logical  Orientation:  Full (Time, Place, and Person)  Thought Content:  Negative  Suicidal Thoughts:  No  Homicidal Thoughts:  No  Memory:  Immediate;   Good Recent;   Good Remote;   Good  Judgement:  Fair  Insight:  Shallow  Psychomotor Activity:  Normal  Concentration:  Good  Recall:  Good  Akathisia:  Negative  Handed:  Right  AIMS (if indicated):     Assets:   Communication Skills Housing Social Support  Sleep:      Treatment Plan Summary: Daily contact with patient to assess and evaluate symptoms and progress in treatment Medication management Antonio Antonio Floyd is making no threats to others or to himself and wants to go home.  He has sobered up. Discharge home today  Benjaman Pott 05/22/2013 12:43 PM

## 2013-05-22 NOTE — ED Notes (Addendum)
Written dc instructions reviewed w/ pt, pt verbalized understanding.  Pt encouraged to follow up w/  [facility], take his medications as directed, and follow up w/  primary care concerning his BP.  Pt reports hx of elevated BP, but has not been under a MD care for it.  Importance of BP control discussed w/ pt.  Pt encouraged to return/seek treatment for any return of suicidal/homicidal thoughts or urges.  Pt verbalized understanding.  Pt ambulatory  To dc window w/ mHt, belongings returned after leaving the unit.
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
## 2013-05-22 NOTE — Progress Notes (Signed)
[facility] provided pt with a [employer] Orange Card application, highlighting [facility].  °

## 2013-05-22 NOTE — ED Notes (Signed)
Up to the bathroom 

## 2013-05-22 NOTE — ED Notes (Signed)
EDP aware of BP. 

## 2013-05-22 NOTE — ED Notes (Signed)
Dr Taylor into see 

## 2013-05-22 NOTE — ED Notes (Signed)
Dr Ladona Ridgel and Shuvon BP into see

## 2013-05-22 NOTE — ED Notes (Signed)
Pt wanded by security. 

## 2013-05-22 NOTE — Progress Notes (Signed)
Writer consulted with Dr. Ladona Ridgel and the NP, [facility]) regarding the patient not meeting criteria for inpatient hospitalization.   Writer informed the ER MD and the nurse working with the patient of the patients discharge with established services [facility] at for medication management and outpatient therapy.

## 2014-04-23 ENCOUNTER — Emergency Department (HOSPITAL_COMMUNITY)
Admission: EM | Admit: 2014-04-23 | Discharge: 2014-04-23 | Disposition: A | Discharge status: Home / Self Care | Payer: Self-pay | Attending: Emergency Medicine | Admitting: Emergency Medicine

## 2014-04-23 ENCOUNTER — Encounter (HOSPITAL_COMMUNITY): Payer: Self-pay | Admitting: Emergency Medicine

## 2014-04-23 DIAGNOSIS — F10120 Alcohol abuse with intoxication, uncomplicated: Secondary | ICD-10-CM | POA: Diagnosis present

## 2014-04-23 DIAGNOSIS — Z720 Tobacco use: Secondary | ICD-10-CM | POA: Insufficient documentation

## 2014-04-23 DIAGNOSIS — F319 Bipolar disorder, unspecified: Secondary | ICD-10-CM | POA: Insufficient documentation

## 2014-04-23 DIAGNOSIS — F10221 Alcohol dependence with intoxication delirium: Secondary | ICD-10-CM

## 2014-04-23 DIAGNOSIS — Z8659 Personal history of other mental and behavioral disorders: Secondary | ICD-10-CM

## 2014-04-23 DIAGNOSIS — F209 Schizophrenia, unspecified: Secondary | ICD-10-CM | POA: Insufficient documentation

## 2014-04-23 DIAGNOSIS — F1994 Other psychoactive substance use, unspecified with psychoactive substance-induced mood disorder: Secondary | ICD-10-CM

## 2014-04-23 LAB — CBC
HEMATOCRIT: 46.9 % (ref 39.0–52.0)
Hemoglobin: 16.2 g/dL (ref 13.0–17.0)
MCH: 30.2 pg (ref 26.0–34.0)
MCHC: 34.5 g/dL (ref 30.0–36.0)
MCV: 87.3 fL (ref 78.0–100.0)
PLATELETS: 295 10*3/uL (ref 150–400)
RBC: 5.37 MIL/uL (ref 4.22–5.81)
RDW: 15 % (ref 11.5–15.5)
WBC: 13.6 10*3/uL — AB (ref 4.0–10.5)

## 2014-04-23 LAB — COMPREHENSIVE METABOLIC PANEL
ALBUMIN: 4.2 g/dL (ref 3.5–5.2)
ALK PHOS: 68 U/L (ref 39–117)
ALT: 24 U/L (ref 0–53)
AST: 22 U/L (ref 0–37)
Anion gap: 18 — ABNORMAL HIGH (ref 5–15)
BILIRUBIN TOTAL: 0.2 mg/dL — AB (ref 0.3–1.2)
BUN: 13 mg/dL (ref 6–23)
CHLORIDE: 102 meq/L (ref 96–112)
CO2: 19 mEq/L (ref 19–32)
Calcium: 9.2 mg/dL (ref 8.4–10.5)
Creatinine, Ser: 1.24 mg/dL (ref 0.50–1.35)
GFR calc Af Amer: 84 mL/min — ABNORMAL LOW (ref >90)
GFR, EST NON AFRICAN AMERICAN: 73 mL/min — AB (ref >90)
Glucose, Bld: 103 mg/dL — ABNORMAL HIGH (ref 70–99)
POTASSIUM: 3.6 meq/L — AB (ref 3.7–5.3)
Sodium: 139 mEq/L (ref 137–147)
Total Protein: 7.8 g/dL (ref 6.0–8.3)

## 2014-04-23 LAB — RAPID URINE DRUG SCREEN, HOSP PERFORMED
Amphetamines: NOT DETECTED
BENZODIAZEPINES: NOT DETECTED
Barbiturates: NOT DETECTED
COCAINE: NOT DETECTED
Opiates: NOT DETECTED
TETRAHYDROCANNABINOL: POSITIVE — AB

## 2014-04-23 LAB — ETHANOL: ALCOHOL ETHYL (B): 233 mg/dL — AB (ref 0–11)

## 2014-04-23 LAB — ACETAMINOPHEN LEVEL: Acetaminophen (Tylenol), Serum: <15 ug/mL (ref 10–30)

## 2014-04-23 LAB — SALICYLATE LEVEL: Salicylate Lvl: <2 mg/dL — ABNORMAL LOW (ref 2.8–20.0)

## 2014-04-23 MED ORDER — NICOTINE 21 MG/24HR TD PT24: 21.0000 mg | MEDICATED_PATCH | Freq: Every day | TRANSDERMAL | Status: DC

## 2014-04-23 MED ORDER — ACETAMINOPHEN 325 MG PO TABS: 650.0000 mg | ORAL_TABLET | ORAL | Status: DC | PRN

## 2014-04-23 MED ORDER — ONDANSETRON HCL 4 MG PO TABS: 4.0000 mg | ORAL_TABLET | Freq: Three times a day (TID) | ORAL | Status: DC | PRN

## 2014-04-23 MED ORDER — ZOLPIDEM TARTRATE 5 MG PO TABS: 5.0000 mg | ORAL_TABLET | Freq: Every evening | ORAL | Status: DC | PRN

## 2014-04-23 MED ORDER — ALUM & MAG HYDROXIDE-SIMETH 200-200-20 MG/5ML PO SUSP: 30.0000 mL | ORAL | Status: DC | PRN

## 2014-04-23 MED ORDER — IBUPROFEN 200 MG PO TABS: 600.0000 mg | ORAL_TABLET | Freq: Three times a day (TID) | ORAL | Status: DC | PRN

## 2014-04-23 MED ORDER — LORAZEPAM 1 MG PO TABS: 1.0000 mg | ORAL_TABLET | Freq: Three times a day (TID) | ORAL | Status: DC | PRN

## 2014-04-23 NOTE — Consult Note (Signed)
[place] Psychiatry Consult   Reason for Consult:  Insomnia  Referring Physician:   edp BREYLON Floyd is an 38 y.o. male. Total Time spent with patient: 30 minutes  Assessment: AXIS I:  Alcohol Abuse, Substance Abuse and Substance Induced Mood Disorder AXIS II:  Deferred AXIS III:   Past Medical History  Diagnosis Date  . Bipolar 1 disorder   . Schizophrenia    AXIS IV:  other psychosocial or environmental problems and problems related to social environment AXIS V:  61-70 mild symptoms  Plan:  No evidence of imminent risk to self or others at present.   Recommend psychiatric Inpatient admission when medically cleared. Supportive therapy provided about ongoing stressors. Discussed crisis plan, support from social network, calling 911, coming to the Emergency Department, and calling Suicide Hotline.  Subjective:    HPI:  Antonio Floyd is a 38 y.o. male patient presented [facility] intoxicated.  Patient states that he has not been able to sleep and "I was just having a episode yesterday. I ain't rested.  I'm good, I guess I was just feeling down yesterday, damn devil fucking with my head.  I ain't gone hurt not body or my self or nothing like that.  I got kids"  Patient states in past he has gone to [facility] for outpatient services for Bipolar disorder.  At one time he was taking Depakote and Seroquel and states that they worked.  Patient states that he slept good last night; "I feel good and I am ready to go home; I just needed to rest."  Patient states that he can follow up with [facility].  Patient is employed.  Denies suicidal/homicidal ideation, auditory/visual hallucinations, and psychosis.    HPI Elements:   Location:  Insomnia  . Quality:  Agitated related to not being able to sleep    . Severity:  Unable to whine down after getting off of work second shift  . Timing:  Unable to whine down after getting off of work second shift  . Review of Systems   Psychiatric/Behavioral: Positive for substance abuse. Negative for depression, suicidal ideas, hallucinations and memory loss. The patient has insomnia. The patient is not nervous/anxious.   All other systems reviewed and are negative.  No family history on file.  Past Psychiatric History: Past Medical History  Diagnosis Date  . Bipolar 1 disorder   . Schizophrenia     reports that he has been smoking Cigarettes.  He has been smoking about 0.50 packs per day. He does not have any smokeless tobacco history on file. He reports that he drinks alcohol. He reports that he uses illicit drugs (Marijuana). No family history on file.         Allergies:  No Known Allergies  ACT Assessment Complete:  Yes:    Educational Status    Risk to Self: Risk to self with the past 6 months Is patient at risk for suicide?: Yes Substance abuse history and/or treatment for substance abuse?: No  Risk to Others:    Abuse:    Prior Inpatient Therapy:    Prior Outpatient Therapy:    Additional Information:         Objective: Blood pressure 146/79, pulse 87, temperature 98.4 F (36.9 C), temperature source Oral, resp. rate 16, height '5\' 8"'  (1.727 m), weight 81.647 kg (180 lb), SpO2 99.00%.Body mass index is 27.38 kg/(m^2). Results for orders placed during the hospital encounter of 04/23/14 (from the past 72 hour(s))  URINE RAPID  DRUG SCREEN (HOSP PERFORMED)     Status: Abnormal   Collection Time    04/23/14  1:40 AM      Result Value Ref Range   Opiates NONE DETECTED  NONE DETECTED   Cocaine NONE DETECTED  NONE DETECTED   Benzodiazepines NONE DETECTED  NONE DETECTED   Amphetamines NONE DETECTED  NONE DETECTED   Tetrahydrocannabinol POSITIVE (*) NONE DETECTED   Barbiturates NONE DETECTED  NONE DETECTED   Comment:            DRUG SCREEN FOR MEDICAL PURPOSES     ONLY.  IF CONFIRMATION IS NEEDED     FOR ANY PURPOSE, NOTIFY LAB     WITHIN 5 DAYS.                LOWEST DETECTABLE LIMITS     FOR  URINE DRUG SCREEN     Drug Class       Cutoff (ng/mL)     Amphetamine      1000     Barbiturate      200     Benzodiazepine   235     Tricyclics       361     Opiates          300     Cocaine          300     THC              50  ACETAMINOPHEN LEVEL     Status: None   Collection Time    04/23/14  1:43 AM      Result Value Ref Range   Acetaminophen (Tylenol), Serum <15.0  10 - 30 ug/mL   Comment:            THERAPEUTIC CONCENTRATIONS VARY     SIGNIFICANTLY. A RANGE OF 10-30     ug/mL MAY BE AN EFFECTIVE     CONCENTRATION FOR MANY PATIENTS.     HOWEVER, SOME ARE BEST TREATED     AT CONCENTRATIONS OUTSIDE THIS     RANGE.     ACETAMINOPHEN CONCENTRATIONS     >150 ug/mL AT 4 HOURS AFTER     INGESTION AND >50 ug/mL AT 12     HOURS AFTER INGESTION ARE     OFTEN ASSOCIATED WITH TOXIC     REACTIONS.  CBC     Status: Abnormal   Collection Time    04/23/14  1:43 AM      Result Value Ref Range   WBC 13.6 (*) 4.0 - 10.5 K/uL   RBC 5.37  4.22 - 5.81 MIL/uL   Hemoglobin 16.2  13.0 - 17.0 g/dL   HCT 46.9  39.0 - 52.0 %   MCV 87.3  78.0 - 100.0 fL   MCH 30.2  26.0 - 34.0 pg   MCHC 34.5  30.0 - 36.0 g/dL   RDW 15.0  11.5 - 15.5 %   Platelets 295  150 - 400 K/uL  COMPREHENSIVE METABOLIC PANEL     Status: Abnormal   Collection Time    04/23/14  1:43 AM      Result Value Ref Range   Sodium 139  137 - 147 mEq/L   Potassium 3.6 (*) 3.7 - 5.3 mEq/L   Chloride 102  96 - 112 mEq/L   CO2 19  19 - 32 mEq/L   Glucose, Bld 103 (*) 70 - 99 mg/dL   BUN 13  6 - 23 mg/dL  Creatinine, Ser 1.24  0.50 - 1.35 mg/dL   Calcium 9.2  8.4 - 10.5 mg/dL   Total Protein 7.8  6.0 - 8.3 g/dL   Albumin 4.2  3.5 - 5.2 g/dL   AST 22  0 - 37 U/L   ALT 24  0 - 53 U/L   Alkaline Phosphatase 68  39 - 117 U/L   Total Bilirubin 0.2 (*) 0.3 - 1.2 mg/dL   GFR calc non Af Amer 73 (*) >90 mL/min   GFR calc Af Amer 84 (*) >90 mL/min   Comment: (NOTE)     The eGFR has been calculated using the CKD EPI equation.      This calculation has not been validated in all clinical situations.     eGFR's persistently <90 mL/min signify possible Chronic Kidney     Disease.   Anion gap 18 (*) 5 - 15  ETHANOL     Status: Abnormal   Collection Time    04/23/14  1:43 AM      Result Value Ref Range   Alcohol, Ethyl (B) 233 (*) 0 - 11 mg/dL   Comment:            LOWEST DETECTABLE LIMIT FOR     SERUM ALCOHOL IS 11 mg/dL     FOR MEDICAL PURPOSES ONLY  SALICYLATE LEVEL     Status: Abnormal   Collection Time    04/23/14  1:43 AM      Result Value Ref Range   Salicylate Lvl <4.0 (*) 2.8 - 20.0 mg/dL   Labs are reviewed see abnormal values above also UDS positive THC and ETOH 233 .  Medications reviewed and no changes made.  Current Facility-Administered Medications  Medication Dose Route Frequency Provider Last Rate Last Dose  . acetaminophen (TYLENOL) tablet 650 mg  650 mg Oral Q4H PRN Antonietta Breach, PA-C      . alum & mag hydroxide-simeth (MAALOX/MYLANTA) 200-200-20 MG/5ML suspension 30 mL  30 mL Oral PRN Antonietta Breach, PA-C      . ibuprofen (ADVIL,MOTRIN) tablet 600 mg  600 mg Oral Q8H PRN Antonietta Breach, PA-C      . LORazepam (ATIVAN) tablet 1 mg  1 mg Oral Q8H PRN Antonietta Breach, PA-C      . nicotine (NICODERM CQ - dosed in mg/24 hours) patch 21 mg  21 mg Transdermal Daily Antonietta Breach, PA-C      . ondansetron [facility]) tablet 4 mg  4 mg Oral Q8H PRN Antonietta Breach, PA-C      . zolpidem (AMBIEN) tablet 5 mg  5 mg Oral QHS PRN Antonietta Breach, PA-C       Current Outpatient Prescriptions  Medication Sig Dispense Refill  . ibuprofen (ADVIL,MOTRIN) 200 MG tablet Take 100 mg by mouth every 6 (six) hours as needed for moderate pain.      Marland Kitchen QUEtiapine (SEROQUEL XR) 400 MG 24 hr tablet Take 400 mg by mouth at bedtime.        Psychiatric Specialty Exam:     Blood pressure 146/79, pulse 87, temperature 98.4 F (36.9 C), temperature source Oral, resp. rate 16, height '5\' 8"'  (1.727 m), weight 81.647 kg (180 lb), SpO2 99.00%.Body mass  index is 27.38 kg/(m^2).  General Appearance: Casual  Eye Contact::  Good  Speech:  Clear and Coherent and Normal Rate  Volume:  Normal  Mood:  Anxious  Affect:  Appropriate and Congruent  Thought Process:  Circumstantial, Coherent, Goal Directed and Logical  Orientation:  Full (  Time, Place, and Person)  Thought Content:  WDL  Suicidal Thoughts:  No  Homicidal Thoughts:  No  Memory:  Immediate;   Good Recent;   Good Remote;   Good  Judgement:  Intact  Insight:  Present  Psychomotor Activity:  Normal  Concentration:  Fair  Recall:  Good  Fund of Knowledge:Good  Language: Good  Akathisia:  No  Handed:  Right  AIMS (if indicated):     Assets:  Communication Skills Desire for Improvement Housing Social Support Transportation  Sleep:      Musculoskeletal: Strength & Muscle Tone: within normal limits Gait & Station: normal Patient leans: Right  Treatment Plan Summary: Discharge home.  Patient to follow up with Nicholaus Bloom, Bush Murdoch, FNP-BC 04/23/2014 12:08 PM

## 2014-04-23 NOTE — ED Notes (Signed)
Pt reports he is "tripping and dont want to do anything stupid." Reports "i have a hx of cutting my throat and the voices are telling me to do that".

## 2014-04-23 NOTE — Consult Note (Signed)
Face to face evaluation and I agree with this note 

## 2014-04-23 NOTE — Discharge Instructions (Signed)
Finding Treatment for Alcohol and Drug Addiction It can be hard to find the right place to get professional treatment. Here are some important things to consider:  There are different types of treatment to choose from.  Some programs are live-in (residential) while others are not (outpatient). Sometimes a combination is offered.  No single type of program is right for everyone.  Most treatment programs involve a combination of education, counseling, and a 12-step, spiritually-based approach.  There are non-spiritually based programs (not 12-step).  Some treatment programs are government sponsored. They are geared for patients without private insurance.  Treatment programs can vary in many respects such as:  Cost and types of insurance accepted.  Types of on-site medical services offered.  Length of stay, setting, and size.  Overall philosophy of treatment. A person may need specialized treatment or have needs not addressed by all programs. For example, adolescents need treatment appropriate for their age. Other people have secondary disorders that must be managed as well. Secondary conditions can include mental illness, such as depression or diabetes. Often, a period of detoxification from alcohol or drugs is needed. This requires medical supervision and not all programs offer this. THINGS TO CONSIDER WHEN SELECTING A TREATMENT PROGRAM   Is the program certified by the appropriate government agency? Even private programs must be certified and employ certified professionals.  Does the program accept your insurance? If not, can a payment plan be set up?  Is the facility clean, organized, and well run? Do they allow you to speak with graduates who can share their treatment experience with you? Can you tour the facility? Can you meet with staff?  Does the program meet the full range of individual needs?  Does the treatment program address sexual orientation and physical disabilities?  Do they provide age, gender, and culturally appropriate treatment services?  Is treatment available in languages other than English?  Is long-term aftercare support or guidance encouraged and provided?  Is assessment of an individual's treatment plan ongoing to ensure it meets changing needs?  Does the program use strategies to encourage reluctant patients to remain in treatment long enough to increase the likelihood of success?  Does the program offer counseling (individual or group) and other behavioral therapies?  Does the program offer medicine as part of the treatment regimen, if needed?  Is there ongoing monitoring of possible relapse? Is there a defined relapse prevention program? Are services or referrals offered to family members to ensure they understand addiction and the recovery process? This would help them support the recovering individual.  Are 12-step meetings held at the center or is transport available for patients to attend outside meetings? In countries outside of the [place], see local directories for contact information for services in your area. Document Released: 06/01/2005 Document Revised: 09/25/2011 Document Reviewed: 12/12/2007 Exit[employer] Patient Information 2015 [employer]. This information is not intended to replace advice given to you by your health care provider. Make sure you discuss any questions you have with your health care provider.  

## 2014-04-23 NOTE — [facility] Suicide Risk Assessment (Cosign Needed)
Suicide Risk Assessment  Discharge Assessment     Demographic Factors:  Male and Caucasian  Total Time spent with patient: 30 minutes  Psychiatric Specialty Exam:     Blood pressure 146/79, pulse 87, temperature 98.4 F (36.9 C), temperature source Oral, resp. rate 16, height 5\' 8"  (1.727 m), weight 81.647 kg (180 lb), SpO2 99.00%.Body mass index is 27.38 kg/(m^2).   General Appearance: Casual   Eye Contact:: Good   Speech: Clear and Coherent and Normal Rate   Volume: Normal   Mood: Anxious   Affect: Appropriate and Congruent   Thought Process: Circumstantial, Coherent, Goal Directed and Logical   Orientation: Full (Time, Place, and Person)   Thought Content: WDL   Suicidal Thoughts: No   Homicidal Thoughts: No   Memory: Immediate; Good  Recent; Good  Remote; Good   Judgement: Intact   Insight: Present   Psychomotor Activity: Normal   Concentration: Fair   Recall: Good   Fund of Knowledge:Good   Language: Good   Akathisia: No   Handed: Right   AIMS (if indicated):   Assets: Communication Skills  Desire for Improvement  Housing  Social Support  Transportation   Sleep:   Musculoskeletal:  Strength & Muscle Tone: within normal limits  Gait & Station: normal  Patient leans: Right    Mental Status Per Nursing Assessment::   On Admission:     Current Mental Status by Physician: Patient denies suicidal/homicidal ideation, psychosis, and pararnoia  Loss Factors: NA  Historical Factors: NA  Risk Reduction Factors:   Responsible for children under 37 years of age, Sense of responsibility to family and Employed  Continued Clinical Symptoms:  Alcohol/Substance Abuse/Dependencies  Cognitive Features That Contribute To Risk:  None noted    Suicide Risk:  Minimal: No identifiable suicidal ideation.  Patients presenting with no risk factors but with morbid ruminations; may be classified as minimal risk based on the severity of the depressive  symptoms  Discharge Diagnoses:   AXIS I:  Alcohol Abuse, Substance Abuse and Substance Induced Mood Disorder AXIS II:  Deferred AXIS III:   Past Medical History  Diagnosis Date  . Bipolar 1 disorder   . Schizophrenia    AXIS IV:  other psychosocial or environmental problems and problems related to social environment AXIS V:  61-70 mild symptoms  Plan Of Care/Follow-up recommendations:  Activity:  as tolerated Diet:  as tolerated Other:  Follow up with [facility]  Is patient on multiple antipsychotic therapies at discharge:  No   Has Patient had three or more failed trials of antipsychotic monotherapy by history:  No  Recommended Plan for Multiple Antipsychotic Therapies: NA    Rankin, Shuvon, FNP-BC 04/23/2014, 12:32 PM

## 2014-04-23 NOTE — ED Notes (Signed)
Pt wanded by security and patient belongings removed.  Pt had knife in presence knife is locked in security office.  Pt had shirt pants, shoes, headphones.

## 2014-04-23 NOTE — ED Notes (Signed)
Patient belongings, 1 bag, placed in TCU locker #[id number]

## 2014-04-23 NOTE — ED Notes (Signed)
[facility] Expected date:  Expected time:  Means of arrival:  Comments: [id number]

## 2014-04-23 NOTE — ED Provider Notes (Signed)
CSN: [id number]     Arrival date & time 04/23/14  0101 History   First MD Initiated Contact with Patient 04/23/14 0133     Chief Complaint  Patient presents with  . Hallucinations    auditory  . Suicidal    (Consider location/radiation/quality/duration/timing/severity/associated sxs/prior Treatment) HPI Comments: Patient is a 38 year old male history of bipolar 1 disorder and schizophrenia. He presents to the emergency department for auditory hallucinations x5 days. Patient endorses command hallucinations as he states the voices are telling him to kill himself. Patient stating that they are telling him to slit his throat. He states he has a history of this many years ago which caused him to need to get stitches. Patient states "Nah, I won't kill myself. I don't want to kill myself". He states he is taking his psychiatric medications, but they are not helping his hallucinations. He denies HI and illicit drug use. He states he "drank some 40's" today. No hallucinations at present.  The history is provided by the patient. No language interpreter was used.    Past Medical History  Diagnosis Date  . Bipolar 1 disorder   . Schizophrenia    Past Surgical History  Procedure Laterality Date  . Partial hip arthroplasty    . Appendectomy     No family history on file. History  Substance Use Topics  . Smoking status: Current Every Day Smoker -- 0.50 packs/day    Types: Cigarettes  . Smokeless tobacco: Not on file  . Alcohol Use: Yes    Review of Systems  Psychiatric/Behavioral: Positive for hallucinations and behavioral problems.    Allergies  Review of patient's allergies indicates no known allergies.  Home Medications   Prior to Admission medications   Medication Sig Start Date End Date Taking? Authorizing Provider  ibuprofen (ADVIL,MOTRIN) 200 MG tablet Take 100 mg by mouth every 6 (six) hours as needed for moderate pain.   Yes Historical Provider, MD  QUEtiapine (SEROQUEL XR)  400 MG 24 hr tablet Take 400 mg by mouth at bedtime.   Yes Historical Provider, MD   BP 139/84  Pulse 94  Temp(Src) 98.1 F (36.7 C) (Oral)  Resp 20  Ht 5\' 8"  (1.727 m)  Wt 180 lb (81.647 kg)  BMI 27.38 kg/m2  SpO2 99%  Physical Exam  Nursing note and vitals reviewed. Constitutional: He is oriented to person, place, and time. He appears well-developed and well-nourished. No distress.  Nontoxic/nonseptic appearing. He is sitting in the exam bed watching TV and eating a sandwich.  HENT:  Head: Normocephalic and atraumatic.  Eyes: Conjunctivae and EOM are normal. No scleral icterus.  Neck: Normal range of motion.  Pulmonary/Chest: Effort normal. No respiratory distress.  Musculoskeletal: Normal range of motion.  Neurological: He is alert and oriented to person, place, and time.  Skin: Skin is warm and dry. No rash noted. He is not diaphoretic. No erythema. No pallor.  Psychiatric: His speech is normal. He is withdrawn. He is not actively hallucinating. He expresses no homicidal ideation. He expresses no homicidal plans.  Patient withdrawn, but cooperative. He is calm.    ED Course  Procedures (including critical care time) Labs Review Labs Reviewed  CBC - Abnormal; Notable for the following:    WBC 13.6 (*)    All other components within normal limits  COMPREHENSIVE METABOLIC PANEL - Abnormal; Notable for the following:    Potassium 3.6 (*)    Glucose, Bld 103 (*)    Total Bilirubin 0.2 (*)  GFR calc non Af Amer 73 (*)    GFR calc Af Amer 84 (*)    Anion gap 18 (*)    All other components within normal limits  ETHANOL - Abnormal; Notable for the following:    Alcohol, Ethyl (B) 233 (*)    All other components within normal limits  SALICYLATE LEVEL - Abnormal; Notable for the following:    Salicylate Lvl <2.0 (*)    All other components within normal limits  URINE RAPID DRUG SCREEN (HOSP PERFORMED) - Abnormal; Notable for the following:    Tetrahydrocannabinol POSITIVE  (*)    All other components within normal limits  ACETAMINOPHEN LEVEL    Imaging Review No results found.   EKG Interpretation None      MDM   Final diagnoses:  History of command hallucinations    38 year old male presents to the emergency department today for further evaluation of command hallucinations and suicidal ideations. Patient no longer endorsing suicidal ideations on my questioning, but does endorse command hallucinations prior to arrival. Patient denies homicidal ideations. His ethanol level is 233 and he also endorses marijuana use. Patient otherwise medically cleared. He is currently pending TTS evaluation for further psychiatric workup. He is voluntary and has been fairly calm since being provided a sandwich and drink.   Filed Vitals:   04/23/14 0119 04/23/14 0129  BP: 139/84   Pulse: 94   Temp: 98.1 F (36.7 C)   TempSrc: Oral   Resp: 20   Height:  5\' 8"  (1.727 m)  Weight:  180 lb (81.647 kg)  SpO2: 99%      Antony Madura, PA-C 04/23/14 [phone]

## 2014-04-23 NOTE — ED Notes (Signed)
Patient arrives with [employer]. Per [employer] patient is having auditory hallucinations x5 days. [employer] reports patient has been calm and cooperative but that patient states to them "I'm about to spazz out". [employer] remains at bedside at this time.

## 2014-04-23 NOTE — ED Provider Notes (Signed)
Medical screening examination/treatment/procedure(s) were performed by non-physician practitioner and as supervising physician I was immediately available for consultation/collaboration.   EKG Interpretation None        Tomasita CrumbleAdeleke Noralyn Karim, MD 04/23/14 [phone]

## 2014-06-20 ENCOUNTER — Emergency Department (HOSPITAL_COMMUNITY)
Admission: EM | Admit: 2014-06-20 | Discharge: 2014-06-20 | Disposition: A | Discharge status: Home / Self Care | Payer: Self-pay | Attending: Emergency Medicine | Admitting: Emergency Medicine

## 2014-06-20 ENCOUNTER — Encounter (HOSPITAL_COMMUNITY): Payer: Self-pay | Admitting: Emergency Medicine

## 2014-06-20 DIAGNOSIS — F319 Bipolar disorder, unspecified: Secondary | ICD-10-CM | POA: Insufficient documentation

## 2014-06-20 DIAGNOSIS — F1210 Cannabis abuse, uncomplicated: Secondary | ICD-10-CM | POA: Insufficient documentation

## 2014-06-20 DIAGNOSIS — F209 Schizophrenia, unspecified: Secondary | ICD-10-CM | POA: Insufficient documentation

## 2014-06-20 DIAGNOSIS — F10129 Alcohol abuse with intoxication, unspecified: Secondary | ICD-10-CM | POA: Insufficient documentation

## 2014-06-20 DIAGNOSIS — Z720 Tobacco use: Secondary | ICD-10-CM | POA: Insufficient documentation

## 2014-06-20 LAB — CBC
HCT: 42 % (ref 39.0–52.0)
HEMOGLOBIN: 13.8 g/dL (ref 13.0–17.0)
MCH: 28.9 pg (ref 26.0–34.0)
MCHC: 32.9 g/dL (ref 30.0–36.0)
MCV: 87.9 fL (ref 78.0–100.0)
PLATELETS: 312 10*3/uL (ref 150–400)
RBC: 4.78 MIL/uL (ref 4.22–5.81)
RDW: 15.5 % (ref 11.5–15.5)
WBC: 9.9 10*3/uL (ref 4.0–10.5)

## 2014-06-20 LAB — COMPREHENSIVE METABOLIC PANEL
ALBUMIN: 4 g/dL (ref 3.5–5.2)
ALK PHOS: 61 U/L (ref 39–117)
ALT: 23 U/L (ref 0–53)
ANION GAP: 18 — AB (ref 5–15)
AST: 29 U/L (ref 0–37)
BUN: 13 mg/dL (ref 6–23)
CALCIUM: 9.1 mg/dL (ref 8.4–10.5)
CO2: 19 mEq/L (ref 19–32)
Chloride: 104 mEq/L (ref 96–112)
Creatinine, Ser: 1.23 mg/dL (ref 0.50–1.35)
GFR calc Af Amer: 85 mL/min — ABNORMAL LOW (ref >90)
GFR, EST NON AFRICAN AMERICAN: 74 mL/min — AB (ref >90)
GLUCOSE: 87 mg/dL (ref 70–99)
POTASSIUM: 3.7 meq/L (ref 3.7–5.3)
Sodium: 141 mEq/L (ref 137–147)
Total Protein: 7.4 g/dL (ref 6.0–8.3)

## 2014-06-20 LAB — ETHANOL
Alcohol, Ethyl (B): 231 mg/dL — ABNORMAL HIGH (ref 0–11)
Alcohol, Ethyl (B): 26 mg/dL — ABNORMAL HIGH (ref 0–11)

## 2014-06-20 LAB — RAPID URINE DRUG SCREEN, HOSP PERFORMED
Amphetamines: NOT DETECTED
BARBITURATES: NOT DETECTED
BENZODIAZEPINES: NOT DETECTED
Cocaine: NOT DETECTED
Opiates: NOT DETECTED
Tetrahydrocannabinol: POSITIVE — AB

## 2014-06-20 LAB — ACETAMINOPHEN LEVEL: Acetaminophen (Tylenol), Serum: <15 ug/mL (ref 10–30)

## 2014-06-20 LAB — SALICYLATE LEVEL: Salicylate Lvl: <2 mg/dL — ABNORMAL LOW (ref 2.8–20.0)

## 2014-06-20 MED ORDER — ACETAMINOPHEN 325 MG PO TABS: 650.0000 mg | ORAL_TABLET | ORAL | Status: DC | PRN

## 2014-06-20 NOTE — ED Notes (Signed)
TSS has re-evaluated pt now that he is clinically sober: state pt denies SI, does not meet in pt criteria and can be d/c home with outpt resources.  Samuel JesterKathleen Bri Wakeman, DO 06/20/14 1649

## 2014-06-20 NOTE — ED Notes (Signed)
Pt unable to void at this time. 

## 2014-06-20 NOTE — ED Provider Notes (Addendum)
CSN: [id number]     Arrival date & time 06/20/14  [id number] History   First MD Initiated Contact with Patient 06/20/14 [phone]     Chief Complaint  Patient presents with  . Suicidal     (Consider location/radiation/quality/duration/timing/severity/associated sxs/prior Treatment) HPI Comments: 38 year old male with history of bipolar, substance abuse, alcohol abuse presents with depression and suicidal ideation. Unable to obtain details this patient is clinically intoxicated. On arrival per nursing report patient was agitated and would give minimal details to them as well. Security at bedside. Patient denies any specific plan at this time however his responses are a few words at a time.  The history is provided by the patient, medical records and the EMS personnel.    Past Medical History  Diagnosis Date  . Bipolar 1 disorder   . Schizophrenia    Past Surgical History  Procedure Laterality Date  . Partial hip arthroplasty    . Appendectomy     History reviewed. No pertinent family history. History  Substance Use Topics  . Smoking status: Current Every Day Smoker -- 0.50 packs/day    Types: Cigarettes  . Smokeless tobacco: Not on file  . Alcohol Use: Yes    Review of Systems  Unable to perform ROS: Psychiatric disorder      Allergies  Review of patient's allergies indicates no known allergies.  Home Medications   Prior to Admission medications   Medication Sig Start Date End Date Taking? Authorizing Provider  QUEtiapine (SEROQUEL XR) 400 MG 24 hr tablet Take 400 mg by mouth at bedtime.   Yes Historical Provider, MD   BP 105/54 mmHg  Pulse 85  Temp(Src) 98.4 F (36.9 C) (Oral)  Resp 18  SpO2 93% Physical Exam  Constitutional: He appears well-developed.  HENT:  Head: Normocephalic and atraumatic.  Eyes: Pupils are equal, round, and reactive to light. Right eye exhibits no discharge. Left eye exhibits no discharge.  Neck: Normal range of motion. Neck supple. No tracheal  deviation present.  Cardiovascular: Normal rate and regular rhythm.   Pulmonary/Chest: Effort normal.  Abdominal: Soft. There is no tenderness. There is no guarding.  Musculoskeletal: He exhibits no edema.  Neurological: He is alert.  Difficult neuro exam due to clinical intoxication, patient walking with minimal difficulty, grossly moving arms and legs equal strength bilateral. Pupils appear equal and reactive on a brief exam. Neck is supple no meningismus. Mild slurring speech.  Skin: Skin is warm. No rash noted.  Psychiatric:  Clinically intoxicated  Nursing note and vitals reviewed.   ED Course  Procedures (including critical care time) Labs Review Labs Reviewed  COMPREHENSIVE METABOLIC PANEL - Abnormal; Notable for the following:    Total Bilirubin <0.2 (*)    GFR calc non Af Amer 74 (*)    GFR calc Af Amer 85 (*)    Anion gap 18 (*)    All other components within normal limits  ETHANOL - Abnormal; Notable for the following:    Alcohol, Ethyl (B) 231 (*)    All other components within normal limits  SALICYLATE LEVEL - Abnormal; Notable for the following:    Salicylate Lvl <2.0 (*)    All other components within normal limits  URINE RAPID DRUG SCREEN (HOSP PERFORMED) - Abnormal; Notable for the following:    Tetrahydrocannabinol POSITIVE (*)    All other components within normal limits  ACETAMINOPHEN LEVEL  CBC    Imaging Review No results found.   EKG Interpretation None  MDM   Final diagnoses:  Suicidal ideation  Alcohol abuse with intoxication   Patient with known psychiatric history presents clinically intoxicated with nonspecific suicidal ideation. Plan for observation with security emergency department until clinically not intoxicated and more details can be obtained by behavior health.  No acute issues during my evaluation in the ED except alcohol intoxication.  Filed Vitals:   06/20/14 0542 06/20/14 1212  BP: 159/96 105/54  Pulse: 100 85  Temp:  98.1 F (36.7 C) 98.4 F (36.9 C)  TempSrc: Oral Oral  Resp: 16 18  SpO2: 99% 93%   [facility]  Discussed with Behavioral Health who evaluate patient once clinically sober.  Patient reassessed, normal neuro exam, patient is no longer suicidal. Patient no longer clinically intoxicated. Plan for likely outpatient follow-up, Behavioral Health reassessment pending.   Enid SkeensJoshua M Vicente Weidler, MD 06/20/14 1448  Enid SkeensJoshua M Ebany Bowermaster, MD 06/20/14 [phone]

## 2014-06-20 NOTE — Progress Notes (Signed)
Patient able to contract for safety at this time. Patient denies current SI, HI and AVH. Clinician consulted with Renata Capriceonrad, NP who states Pt does not meet inpatient criteria. Clinician provided updates to RN and Dr. Clarene DukeMcManus who agreed with discharge recommendations.   Nira Retortelilah Denim Start, MSW, LCSW Triage Specialist [phone]

## 2014-06-20 NOTE — Progress Notes (Signed)
Clinician contacted RN to inquire if patient was alert to be assessed. Assessment to be initiated.   Nira Retortelilah Kayte Borchard, MSW, LCSW Triage Specialist [phone]

## 2014-06-20 NOTE — ED Notes (Signed)
Pt's belongings consisting of a sweat shirt, a shirt, pants, socks and shoes were put into a patient belong bag

## 2014-06-20 NOTE — ED Notes (Signed)
TTS counselor attempted to assess patient however pt was sleeping and would not wake up. TTS to re-attempt when patient is alert and oriented. RN informed TTS counselor that patient is also intoxicated.

## 2014-06-20 NOTE — ED Notes (Signed)
Pt arrived with a complaint of suicidal is ideations.  Pt states he is depressed about Christmas.  Pt is a bit agitated in triage.  Pt doesn't want to answer questions.  Pt has made statements that he is thinking of suicide to triage Nurse.

## 2014-06-20 NOTE — ED Notes (Signed)
Bed: [facility] Expected date:  Expected time:  Means of arrival:  Comments: 
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
## 2014-06-20 NOTE — Discharge Instructions (Signed)
°Emergency Department Resource Guide °1) Find a Doctor and Pay Out of Pocket °Although you won't have to find out who is covered by your insurance plan, it is a good idea to ask around and get recommendations. You will then need to call the office and see if the doctor you have chosen will accept you as a new patient and what types of options they offer for patients who are self-pay. Some doctors offer discounts or will set up payment plans for their patients who do not have insurance, but you will need to ask so you aren't surprised when you get to your appointment. ° °2) Contact Your Local Health Department °Not all health departments have doctors that can see patients for sick visits, but many do, so it is worth a call to see if yours does. If you don't know where your local health department is, you can check in your phone book. The CDC also has a tool to help you locate your state's health department, and many state websites also have listings of all of their local health departments. ° °3) Find a Walk-in Clinic °If your illness is not likely to be very severe or complicated, you may want to try a walk in clinic. These are popping up all over the country in pharmacies, drugstores, and shopping centers. They're usually staffed by nurse practitioners or physician assistants that have been trained to treat common illnesses and complaints. They're usually fairly quick and inexpensive. However, if you have serious medical issues or chronic medical problems, these are probably not your best option. ° °No Primary Care Doctor: °- Call Health Connect at  832-8000 - they can help you locate a primary care doctor that  accepts your insurance, provides certain services, etc. °- Physician Referral Service- 1-800-533-3463 ° °Chronic Pain Problems: °Organization         Address  Phone   Notes  °[facility]  (336) 297-2271 Patients need to be referred by their primary care doctor.  ° °Medication  Assistance: °Organization         Address  Phone   Notes  °[place] [facility] [address] °[place], [place] [address] (336) 641-8030 --Must be a resident of [place] °-- Must have NO insurance coverage whatsoever (no Medicaid/ Medicare, etc.) °-- The pt. MUST have a primary care doctor that directs their care regularly and follows them in the community °  °[facility]  (866) 331-1348   °[employer]  (888) 892-1162   ° °Agencies that provide inexpensive medical care: °Organization         Address  Phone   Notes  °[facility]  (336) 832-8035   °[facility]    (336) 832-7272   °[facility] [address] °[place], [place] [address] (336) 832-4777   °[facility] [address], °[place] (336) 271-4999   °[facility]    (336) 373-0678   °[facility]    (336) 272-1050   °[facility] ° [address], [place] Phone:  (336) 832-4444, Fax:  (336) 832-4440 Hours of Operation:  9 am - 6 pm, M-F.  Also accepts Medicaid/Medicare and self-pay.  °[facility] ° [address], [place] Phone: (336) 832-3150, Fax: (336) 832-3151. Hours of Operation:  8:30 am - 5:30 pm, M-F.  Also accepts Medicaid and self-pay.  °[facility] [place] [address], [place] Phone: (336) 878-6027   °[facility] [address], [place], [place] (336)723-1848, Ext. 123 Mondays & Thursdays: 7-9 AM.  First 15 patients are seen on a first come, first serve basis. °  ° °Medicaid-accepting [place] Providers: ° °Organization         Address  Phone   Notes  °[facility] [address], [place] (336) 641-2100 Also accepts self-pay patients.  °[facility] [address], [place] ° (336) 856-9996   °[facility] [address], [place]  (336) 288-8857   °[facility] [address] [place] [address], [place] (336) 299-7000   °Veita Bland [address], [place]  ° (336) 373-1557 Only accepts [place] Access Medicaid patients after they have their name applied to their card.  ° °Self-Pay (no insurance) in [place]: ° °Organization         Address  Phone   Notes  °Sickle Cell Patients, [facility] [address], [place] (336) 832-1970   °[facility] [address], [place] (336) 832-4400   ° [facility] [place] ° [address], [place] (336) 992-4800   °[facility]/Dr. Osei-Bonsu ° [address] [place] [address], [place] or [address], [place] (336) [phone] Phone number for both [place] and [place] locations is the same.  °[facility] [address], [place] (336) 299-0000   °[facility] [address] [place] [address], [place] or [address] (336) 852-7530 °(336) 878-2260   °[facility] [address], [place] (336) 350-1642, phone; (336) 294-5005, fax Sees patients 1st and 3rd Saturday of every month.  Must not qualify for public or private insurance (i.e. Medicaid, Medicare, [place] Health Choice, Veterans' Benefits) • Household income should be no more than 200% of the poverty level •The clinic cannot treat you if you are pregnant or think you are pregnant • Sexually transmitted diseases are not treated at the clinic.  ° ° °Dental Care: °Organization         Address  Phone  Notes  °[place] [facility] [address], [place] (336) 641-6152 Accepts children up to age 21 who are enrolled in Medicaid or [place] Health Choice; pregnant women with a Medicaid card; and children who have applied for Medicaid or [place] Health Choice, but were declined, whose parents can pay a reduced fee at time of service.  °[place]  [facility] [place]  [address], [place] (336) 641-7733 Accepts children up to age 21 who are enrolled in Medicaid or [place] Health Choice; pregnant women with a Medicaid card; and children who have applied for Medicaid or  Health Choice, but were declined, whose parents can pay a reduced fee at time of service.  °[facility] ° [address],  (336) 641-4533 Patients are seen by appointment only. Walk-ins are not accepted. [facility] will see patients 18 years of age and older. °Monday - Tuesday (8am-5pm) °Most Wednesdays (8:30-5pm) °$30 per visit, cash only  °[facility] ° [address], [place] (336) 641-4533 Patients are seen by appointment only. Walk-ins are not accepted. [facility] will see patients 18 years of age and older. °One   Wednesday Evening (Monthly: Volunteer Based).  $30 per visit, cash only  °[facility]  (919) 537-3737 for adults; Children under age 4, call Graduate Pediatric Dentistry at (919) [phone]. Children aged 4-14, please call (919) [phone] to request a pediatric application. ° Dental services are provided in all areas of dental care including fillings, crowns and bridges, complete and partial dentures, implants, gum treatment, root canals, and extractions. Preventive care is also provided. Treatment is provided to both adults and children. °Patients are selected via a lottery and there is often a waiting list. °  °[facility] [address], °[place] ° (336) 763-8833 [email or web address] °  °[facility] [address], [place], [place] (336)723-1848, Ext. 123 Second and Fourth Thursday of each month, opens at 6:30 AM; Clinic ends at 9 AM.  Patients are seen on a first-come first-served basis, and a limited number are seen during each clinic.  ° °[facility] ° [address], [place], [place] (336) 723-7904    Eligibility Requirements °You must have lived in [place], [place], or [place] counties for at least the last three months. °  You cannot be eligible for state or federal sponsored healthcare insurance, including Veterans Administration, Medicaid, or Medicare. °  You generally cannot be eligible for healthcare insurance through your employer.  °  How to apply: °Eligibility screenings are held every Tuesday and Wednesday afternoon from 1:00 pm until 4:00 pm. You do not need an appointment for the interview!  °[facility] [address], [place], [place] 336-631-2330   °[place] [facility]  336-342-8273   °[facility]  336-703-3100   °[facility]  336-570-6415   ° °Behavioral Health Resources in the Community: °Intensive Outpatient Programs °Organization         Address  Phone  Notes  °[facility] [address], [place], [place] 336-878-6098   °[facility] [address], [place], [place] 336-832-9800   °[facility] [address], [place], [place] ° 336-882-2125   °[facility] [address],  °[place], [place] 1-800-853-5163 or 336-641-4981   °Substance Abuse Resources °Organization         Address  Phone  Notes  °[facility]  336-882-2125   °[facility]  336-784-9470   °[facility]  336-285-9073   °[facility]  336-845-3988   °Residential & Outpatient Substance Abuse Program  1-800-659-3381   °Psychological Services °Organization         Address  Phone  Notes  °[facility]  336- 832-9600   °[facility]  336- 378-7881   °[facility] [address], [place] 1-800-853-5163 or 336-641-4981   ° °Mobile Crisis Teams °Organization         Address  Phone  Notes  °[facility], Mobile Crisis Care Unit  1-877-626-1772   °Assertive °[facility] ° [address]  [place], [place] 336-834-9664   °Sharon DeEsch [address] °[place]  336-554-5454   ° °Self-Help/Support Groups °Organization         Address  Phone             Notes  °[facility] - variety of support groups  336- 373-1402 Call for more information  °Narcotics Anonymous (NA), [facility] [address], °[place]   2 meetings at this location  ° °  Residential Treatment Programs °Organization         Address  Phone  Notes  °[facility] [address],    °[place]  1-866-801-8205   °[facility] ° [address], [place], [place] 704-293-8524   °[facility] [address], [place] 336-845-3988 Admissions: 8am-3pm M-F  °[facility] [address]    °[place], [place] 336-841-1104   °[facility] [address], [place], [place] 336-379-7146   °[facility] [address]  °[place], [place] 336-285-9073   °[facility] Intensive Outpatient [address], [place], [place] 336-852-3033   °[facility] [address]  °[place], [place] 1-877-615-2722 or 336-784-9470   °[facility] [address] [place], [place] 336-227-7417 Accepts Medicaid  °[facility] [address]  °[place] 1-800-659-3381 Substance Abuse/Addiction Treatment  ° °[place] Behavioral Health Resources °Organization         Address  Phone  Notes  °[facility]  (888) 581-9988   °Julie Brannon, PhD [address] [place], [place]   (336) 349-5553 or (336) 951-0000   °[facility]   [address] °[place], [place] (336) 349-4454   °[facility] [address], [place], [place] (336) 342-8316 Insurance/Medicaid/sponsorship through [facility]  °[facility] [address]                                    [place], [place] (336) 342-8316 Therapy/tele-psych/case    °[facility] [address]  ° [place], [place] (336) 349-2233    °Dr. Arfeen  (336) 349-4544   °[facility] [place]  [employer] [place] [facility] 1) [address], [place] °2) [address], [place] °3)  [address], [place] (336) 349-3220 °(336) 342-7768 ° °(336) 342-8140   °[place] Child Abuse [facility] (336) 342-1394 or (336) 342-3537 (After Hours)    ° ° °Take your usual prescriptions as previously directed.  Call your regular medical doctor and your mental health provider on Monday to schedule a follow up appointment within the next week.  Return to the Emergency Department immediately sooner if worsening.  ° °

## 2014-06-20 NOTE — ED Notes (Signed)
Received report from Dr. Zavitz 

## 2014-06-20 NOTE — ED Notes (Signed)
Patient Belongings bags x 2 placed in Locker #26.

## 2015-12-29 ENCOUNTER — Encounter (HOSPITAL_COMMUNITY): Payer: Self-pay | Admitting: Emergency Medicine

## 2015-12-29 ENCOUNTER — Emergency Department (HOSPITAL_COMMUNITY)
Admission: EM | Admit: 2015-12-29 | Discharge: 2015-12-29 | Disposition: A | Discharge status: Home / Self Care | Payer: Self-pay | Attending: Emergency Medicine | Admitting: Emergency Medicine

## 2015-12-29 ENCOUNTER — Emergency Department (HOSPITAL_COMMUNITY): Payer: Self-pay

## 2015-12-29 DIAGNOSIS — F319 Bipolar disorder, unspecified: Secondary | ICD-10-CM | POA: Insufficient documentation

## 2015-12-29 DIAGNOSIS — Y939 Activity, unspecified: Secondary | ICD-10-CM | POA: Insufficient documentation

## 2015-12-29 DIAGNOSIS — Y929 Unspecified place or not applicable: Secondary | ICD-10-CM | POA: Insufficient documentation

## 2015-12-29 DIAGNOSIS — Z79899 Other long term (current) drug therapy: Secondary | ICD-10-CM | POA: Insufficient documentation

## 2015-12-29 DIAGNOSIS — Y999 Unspecified external cause status: Secondary | ICD-10-CM | POA: Insufficient documentation

## 2015-12-29 DIAGNOSIS — F17210 Nicotine dependence, cigarettes, uncomplicated: Secondary | ICD-10-CM | POA: Insufficient documentation

## 2015-12-29 DIAGNOSIS — S76912A Strain of unspecified muscles, fascia and tendons at thigh level, left thigh, initial encounter: Secondary | ICD-10-CM | POA: Insufficient documentation

## 2015-12-29 DIAGNOSIS — X58XXXA Exposure to other specified factors, initial encounter: Secondary | ICD-10-CM | POA: Insufficient documentation

## 2015-12-29 MED ORDER — IBUPROFEN 400 MG PO TABS
400.0000 mg | ORAL_TABLET | Freq: Once | ORAL | Status: AC
  Administered 2015-12-29: 400 mg via ORAL
  Filled 2015-12-29: qty 1

## 2015-12-29 MED ORDER — HYDROCODONE-ACETAMINOPHEN 5-325 MG PO TABS
1.0000 | ORAL_TABLET | Freq: Once | ORAL | Status: AC
  Administered 2015-12-29: 1 via ORAL
  Filled 2015-12-29: qty 1

## 2015-12-29 MED ORDER — HYDROCODONE-ACETAMINOPHEN 5-325 MG PO TABS: ORAL_TABLET | ORAL | Status: AC
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
## 2015-12-29 NOTE — Discharge Instructions (Signed)
.Rest, Ice intermittently (in the first 24-48 hours), Gentle compression with an Ace wrap, and elevate (Limb above the level of the heart)   Take up to 800mg  of ibuprofen (that is usually 4 over the counter pills)  3 times a day for 5 days. Take with food.  Take vicodin for breakthrough pain, do not drink alcohol, drive, care for children or do other critical tasks while taking vicodin.  Do not hesitate to return to the emergency room for any new, worsening or concerning symptoms.  Please obtain primary care using resource guide below. Let them know that you were seen in the emergency room and that they will need to obtain records for further outpatient management.    Muscle Strain A muscle strain (pulled muscle) happens when a muscle is stretched beyond normal length. It happens when a sudden, violent force stretches your muscle too far. Usually, a few of the fibers in your muscle are torn. Muscle strain is common in athletes. Recovery usually takes 1-2 weeks. Complete healing takes 5-6 weeks.  HOME CARE   Follow the PRICE method of treatment to help your injury get better. Do this the first 2-3 days after the injury:  Protect. Protect the muscle to keep it from getting injured again.  Rest. Limit your activity and rest the injured body part.  Ice. Put ice in a plastic bag. Place a towel between your skin and the bag. Then, apply the ice and leave it on from 15-20 minutes each hour. After the third day, switch to moist heat packs.  Compression. Use a splint or elastic bandage on the injured area for comfort. Do not put it on too tightly.  Elevate. Keep the injured body part above the level of your heart.  Only take medicine as told by your doctor.  Warm up before doing exercise to prevent future muscle strains. GET HELP IF:   You have more pain or puffiness (swelling) in the injured area.  You feel numbness, tingling, or notice a loss of strength in the injured area. MAKE SURE  YOU:   Understand these instructions.  Will watch your condition.  Will get help right away if you are not doing well or get worse.   This information is not intended to replace advice given to you by your health care provider. Make sure you discuss any questions you have with your health care provider.   Document Released: 04/11/2008 Document Revised: 04/23/2013 Document Reviewed: 01/30/2013 Elsevier Interactive Patient Education 2016 [employer]. [employer] Assistance The United Ways 211 is a great source of information about community services available.  Access by dialing 2-1-1 from anywhere in [place], or by website -  PooledIncome.pl.   Other Local Resources (Updated 07/2015)  Financial Assistance   Services    Phone Number and Address  [facility]  Low-cost medical care - 1st and 3rd Saturday of every month  Must not qualify for public or private insurance and must have limited income [phone] [address] [place], [place]    [place] [employer] of Social Services  Child care  Emergency assistance for housing and [employer]  Medicaid [phone] [address] [place], [place] [address]   [facility]  Low-cost medical care for children, communicable diseases, sexually-transmitted diseases, immunizations, maternity care, womens health and family planning [phone] [address] [place], [place] [address]  [facility]   Medication assistance for [place] residents  Must meet income requirements [phone] [address] [place], [place].    [facility]  Child care  Emergency assistance for housing and [employer]arkutilities  Food stamps  Medicaid [phone] [address] [place], [place] [address]  [facility]   Low-cost medical care,    Monday through Friday, 9 am to 6 pm.   Accepts Medicare/Medicaid, and self-pay [phone] [address] [place], [place] [address]  [facility]  Low-cost medical care - Monday through Friday, 8:30 am - 5:30 pm  Accepts Medicaid and self-pay [phone] [address] [place], [place] [address]   [facility]  Primary medical care, including for those with sickle cell disease  Accepts Medicare, Medicaid, insurance and self-pay [phone] [address] [place], [place]  [facility]   Primary medical care  Accepts Medicare, [place], insurance and self-pay [phone] [address], [address] [place], [place] [address]   [facility] Social Services  Child care  Emergency assistance for housing and [employer]ark[employer]ilities  Food stamps  Medicaid [phone] [address] [place], [place] [address]  [facility] of Health and [employer]  Child care  Emergency assistance for housing and [employer]arkutilities  Food stamps  Medicaid [phone] [address] [place], [place] [address]   [facility] Medication [facility]  Medication assistance for [facility] residents with no insurance only  Must have a primary care doctor [phone] [address], [address] [place], [place]  [facility]   Primary medical care  [place] Medicare, [place], insurance  [phone] [address] [place], [place]  [facility]   Medication assistance [phone]  [facility]   Primary medical care  Accepts Medicare, [place], insurance and self-pay [phone] [address] [place], [place] [address]  [facility]   Primary medical care  Accepts Medicare, [place], insurance and self-pay [phone] [address] [place], [place] [address]  [facility]  For [place] residents  between the ages of 518 and 6664 who do not have any form of health insurance, Medicare, [place], or [place] benefits.  Services are provided free of charge to uninsured patients who fall within federal poverty guidelines.    Hours: Tuesdays and Thursdays, 4:15 - 8 pm [phone] [address], [address] [place], [place] [address]  [facility]     Primary medical care  Dental care  Nutritional counseling  Pharmacy  Accepts Medicaid, Medicare, most insurance.  Fees are adjusted based on ability to pay.   [phone] 544 1749 [facility] [address] [place], [place]  [phone] [facility] [address] [place], [place]  [phone] [facility] [place], [place]  [phone] [facility], [address] [place], [place]  [phone] [facility] [address] [place], [place]  [facility]  Womens health and family planning [phone] [address] [place], [place]  [facility] of Social Services  Child care  Emergency assistance for housing and [employer]ly[employer]arkutilities  Food stamps  Medicaid [phone] [address], [place], [place] [address]   [facility]    Ages 7918 and older  Hours: Mondays and Thursdays, 7:00 am - 9:00 am Patients are seen on a first come, first served basis. [phone], [phone] [place], [place]  [place] [facility] of  Social Services  Child care  Emergency assistance for housing and [employer]  Medicaid [phone] [address] [place], [place] [address]  [facility] [facility]  Medication assistance  Rental assistance  Food pantry  Medication assistance  Housing assistance  Emergency food distribution  Utility assistance [phone] [address] [place], [place]  [phone]  [address] [place], [place]  [address] Hours: Tuesdays and Thursdays from 9am - 12 noon by appointment only  [phone] [address] [place], [place] [address]  [facility] Lanae Boast   Accepts private insurance, [place], and [place].  Payment is based on a sliding scale for those without insurance.  Hours: Mondays, Tuesdays and Thursdays, 8:30 am - 5:30 pm.   [phone] [address] [place], [place]  [facility] - Family Medicine at [facility], [place], and [place].  Payment is based on a sliding scale for those without insurance. [phone] [address] [place], [place]  [facility] - Pediatrics at E. Scientist, research (physical sciences), [employer], and [place].  Payment is based on a sliding scale for those without insurance [phone] [address], [employer], [place]  [facility] - Pediatrics at [employer], [place], and [place].  Payment is based on a sliding scale for those without insurance. [phone] [address] [place], [place]  [facility] - Pediatrics at [facility], [place], and [place].  Payment is based on a sliding scale for those without insurance. [phone], ext. [phone] [place], [place].    [facility]  Maternity care.  Accepts Medicaid and self-pay. [phone] [address] [place], [place]

## 2015-12-29 NOTE — ED Notes (Signed)
3 day hx of rt hip that rads to entire groin, pt has good pulses to foot, moves extremity with purpose
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
## 2016-09-01 ENCOUNTER — Encounter (HOSPITAL_COMMUNITY): Payer: Self-pay | Admitting: Nurse Practitioner

## 2016-09-01 ENCOUNTER — Emergency Department (HOSPITAL_COMMUNITY)
Admission: EM | Admit: 2016-09-01 | Discharge: 2016-09-01 | Disposition: A | Discharge status: Home / Self Care | Payer: Self-pay | Attending: Emergency Medicine | Admitting: Emergency Medicine

## 2016-09-01 DIAGNOSIS — Z96649 Presence of unspecified artificial hip joint: Secondary | ICD-10-CM | POA: Insufficient documentation

## 2016-09-01 DIAGNOSIS — F17210 Nicotine dependence, cigarettes, uncomplicated: Secondary | ICD-10-CM | POA: Insufficient documentation

## 2016-09-01 DIAGNOSIS — J069 Acute upper respiratory infection, unspecified: Secondary | ICD-10-CM | POA: Insufficient documentation

## 2016-09-01 DIAGNOSIS — Z79899 Other long term (current) drug therapy: Secondary | ICD-10-CM | POA: Insufficient documentation

## 2016-09-01 DIAGNOSIS — R112 Nausea with vomiting, unspecified: Secondary | ICD-10-CM | POA: Insufficient documentation

## 2016-09-01 DIAGNOSIS — R197 Diarrhea, unspecified: Secondary | ICD-10-CM | POA: Insufficient documentation

## 2016-09-01 LAB — CBC WITH DIFFERENTIAL/PLATELET
BASOS ABS: 0 10*3/uL (ref 0.0–0.1)
Basophils Relative: 0 %
Eosinophils Absolute: 0 10*3/uL (ref 0.0–0.7)
Eosinophils Relative: 0 %
HCT: 45.3 % (ref 39.0–52.0)
HEMOGLOBIN: 15 g/dL (ref 13.0–17.0)
LYMPHS PCT: 10 %
Lymphs Abs: 0.9 10*3/uL (ref 0.7–4.0)
MCH: 29.4 pg (ref 26.0–34.0)
MCHC: 33.1 g/dL (ref 30.0–36.0)
MCV: 88.6 fL (ref 78.0–100.0)
MONO ABS: 1.5 10*3/uL — AB (ref 0.1–1.0)
MONOS PCT: 16 %
NEUTROS PCT: 74 %
Neutro Abs: 6.8 10*3/uL (ref 1.7–7.7)
Platelets: 291 10*3/uL (ref 150–400)
RBC: 5.11 MIL/uL (ref 4.22–5.81)
RDW: 16.6 % — ABNORMAL HIGH (ref 11.5–15.5)
WBC: 9.2 10*3/uL (ref 4.0–10.5)

## 2016-09-01 LAB — COMPREHENSIVE METABOLIC PANEL
ALBUMIN: 4.6 g/dL (ref 3.5–5.0)
ALT: 29 U/L (ref 17–63)
AST: 29 U/L (ref 15–41)
Alkaline Phosphatase: 61 U/L (ref 38–126)
Anion gap: 13 (ref 5–15)
BUN: 13 mg/dL (ref 6–20)
CO2: 27 mmol/L (ref 22–32)
Calcium: 9.8 mg/dL (ref 8.9–10.3)
Chloride: 102 mmol/L (ref 101–111)
Creatinine, Ser: 1.34 mg/dL — ABNORMAL HIGH (ref 0.61–1.24)
GFR calc non Af Amer: >60 mL/min (ref >60)
GLUCOSE: 116 mg/dL — AB (ref 65–99)
POTASSIUM: 4.1 mmol/L (ref 3.5–5.1)
Sodium: 142 mmol/L (ref 135–145)
Total Bilirubin: 0.4 mg/dL (ref 0.3–1.2)
Total Protein: 7.8 g/dL (ref 6.5–8.1)

## 2016-09-01 LAB — URINALYSIS, ROUTINE W REFLEX MICROSCOPIC
BACTERIA UA: NONE SEEN
BILIRUBIN URINE: NEGATIVE
Glucose, UA: NEGATIVE mg/dL
HGB URINE DIPSTICK: NEGATIVE
KETONES UR: 20 mg/dL — AB
Leukocytes, UA: NEGATIVE
Nitrite: NEGATIVE
PROTEIN: 100 mg/dL — AB
SQUAMOUS EPITHELIAL / LPF: NONE SEEN
Specific Gravity, Urine: 1.029 (ref 1.005–1.030)
pH: 6 (ref 5.0–8.0)

## 2016-09-01 MED ORDER — ONDANSETRON HCL 4 MG PO TABS: 4.0000 mg | ORAL_TABLET | Freq: Three times a day (TID) | ORAL | 0 refills | Status: AC | PRN

## 2016-09-01 MED ORDER — METOCLOPRAMIDE HCL 5 MG/ML IJ SOLN
10.0000 mg | Freq: Once | INTRAMUSCULAR | Status: AC
  Administered 2016-09-01: 10 mg via INTRAVENOUS
  Filled 2016-09-01: qty 2

## 2016-09-01 MED ORDER — SODIUM CHLORIDE 0.9 % IV SOLN
INTRAVENOUS | Status: AC
  Administered 2016-09-01: 17:00:00 via INTRAVENOUS

## 2016-09-01 MED ORDER — ONDANSETRON HCL 4 MG/2ML IJ SOLN
4.0000 mg | Freq: Once | INTRAMUSCULAR | Status: AC
  Administered 2016-09-01: 4 mg via INTRAVENOUS
  Filled 2016-09-01: qty 2

## 2016-09-01 NOTE — ED Notes (Addendum)
Kept pt. Fluids running per NP, also gave pt Gatorade per NP

## 2016-09-01 NOTE — ED Notes (Signed)
Pt stated he could not void at this time.

## 2016-09-01 NOTE — ED Notes (Signed)
Pt ambulated to room from waiting room. 

## 2016-09-01 NOTE — Discharge Instructions (Signed)
Start with clear liquids today and then advance to the diet for diarrhea. Return if symptoms worsen.

## 2016-09-01 NOTE — ED Triage Notes (Signed)
Pt presents with c/o flu like symptoms. The symptoms began last night. He reports fevers, malaise, chills, body aches, congestion, productive cough. He tried alka seltzer cold with temporary relief. He has had household members with flu like illness.

## 2016-09-01 NOTE — ED Provider Notes (Signed)
[facility]EMER[facility]Y DEPT Provider Note   CSN: [id number] Arrival date & time: 09/01/16  1217   By signing my name below, I, Soijett Blue, attest that this documentation has been prepared under the direction and in the presence of Kerrie Buffalo, NP Electronically Signed: Soijett Blue, ED Scribe. 09/01/16. 4:46 PM.  History   Chief Complaint Chief Complaint  Patient presents with  . Influenza    HPI Antonio Floyd is a 41 y.o. male who presents to the Emergency Department complaining of flu-like symptoms onset yesterday morning. Pt reports associated fever with a max temp of 103, chills, fatigue, cough, vomiting x 20 episodes since last night, diarrhea x 4-5 episodes, abdominal pain, chest wall tenderness due to vomiting, generalized weakness, and lightheadedness. Pt has tried alka-seltzer cold with mild relief of his symptoms. Pt notes that he has sick contacts with similar symptoms at home. He denies sore throat and any other symptoms.      The history is provided by the patient. No language interpreter was used.    Past Medical History:  Diagnosis Date  . Bipolar 1 disorder (HCC)   . Schizophrenia [facility])     Patient Active Problem List   Diagnosis Date Noted  . Substance induced mood disorder (HCC) 04/23/2014  . Alcohol abuse with intoxication, uncomplicated (HCC) 04/23/2014  . Bipolar mood disorder (HCC) 05/22/2013    Past Surgical History:  Procedure Laterality Date  . APPENDECTOMY    . PARTIAL HIP ARTHROPLASTY         Home Medications    Prior to Admission medications   Medication Sig Start Date End Date Taking? Authorizing Provider  HYDROcodone-acetaminophen (NORCO/VICODIN) 5-325 MG tablet Take 1-2 tablets by mouth every 6 hours as needed for pain and/or cough. 12/29/15   Nicole Pisciotta, PA-C  ondansetron (ZOFRAN) 4 MG tablet Take 1 tablet (4 mg total) by mouth every 8 (eight) hours as needed for nausea or vomiting. 09/01/16   Trynity Skousen Orlene Och, NP  QUEtiapine  (SEROQUEL XR) 400 MG 24 hr tablet Take 400 mg by mouth at bedtime.    Historical Provider, MD    Family History History reviewed. No pertinent family history.  Social History Social History  Substance Use Topics  . Smoking status: Current Every Day Smoker    Packs/day: 0.50    Types: Cigarettes  . Smokeless tobacco: Never Used  . Alcohol use Yes     Allergies   Patient has no known allergies.   Review of Systems Review of Systems  Constitutional: Positive for chills, fatigue and fever.  HENT: Negative for sore throat.   Respiratory: Positive for cough.        +chest wall tenderness due to vomiting  Gastrointestinal: Positive for abdominal pain, diarrhea and vomiting.  Musculoskeletal: Positive for myalgias.  Neurological: Positive for weakness (generalized) and light-headedness.     Physical Exam Updated Vital Signs BP 137/90   Pulse 93   Temp 98.9 F (37.2 C) (Oral)   Resp 16   SpO2 100%   Physical Exam  Constitutional: He is oriented to person, place, and time. He appears well-developed and well-nourished. No distress.  Patient vomiting in exam room.  HENT:  Head: Normocephalic and atraumatic.  Right Ear: Tympanic membrane and ear canal normal.  Left Ear: Tympanic membrane and ear canal normal.  Mouth/Throat: Uvula is midline and mucous membranes are normal. Posterior oropharyngeal erythema present. No posterior oropharyngeal edema.  Mild posterior oropharyngeal erythema without edema.   Eyes: Conjunctivae and EOM are  normal.  Neck: Neck supple.  Cardiovascular: Normal rate and regular rhythm.   Pulmonary/Chest: Effort normal. No respiratory distress. He has no wheezes. He has no rales.  Abdominal: Soft. Bowel sounds are normal. There is no tenderness. There is no CVA tenderness.  No CVA tenderness  Musculoskeletal: Normal range of motion.  Neurological: He is alert and oriented to person, place, and time.  Skin: Skin is warm and dry.  Psychiatric: He has  a normal mood and affect. His behavior is normal.  Nursing note and vitals reviewed.    ED Treatments / Results  DIAGNOSTIC STUDIES: Oxygen Saturation is 99% on RA, nl by my interpretation.    COORDINATION OF CARE: 4:44 PM Discussed treatment plan with pt at bedside which includes zofran, UA, labs, IVF and pt agreed to plan.  7:03 PM- Pt re-evaluated and notes that he consumed PO fluids, with an episode of emesis following. Will treat with Reglan 10 mg IV  Labs (all labs ordered are listed, but only abnormal results are displayed) Labs Reviewed  URINALYSIS, ROUTINE W REFLEX MICROSCOPIC - Abnormal; Notable for the following:       Result Value   Ketones, ur 20 (*)    Protein, ur 100 (*)    All other components within normal limits  COMPREHENSIVE METABOLIC PANEL - Abnormal; Notable for the following:    Glucose, Bld 116 (*)    Creatinine, Ser 1.34 (*)    All other components within normal limits  CBC WITH DIFFERENTIAL/PLATELET - Abnormal; Notable for the following:    RDW 16.6 (*)    Monocytes Absolute 1.5 (*)    All other components within normal limits    Procedures Procedures (including critical care time)  Medications Ordered in ED Medications  0.9 %  sodium chloride infusion ( Intravenous Stopped 09/01/16 2024)  ondansetron (ZOFRAN) injection 4 mg (4 mg Intravenous Given 09/01/16 1713)  metoCLOPramide (REGLAN) injection 10 mg (10 mg Intravenous Given 09/01/16 1919)   Patient improved with IV hydration and medications for n/v. Taking PO fluids without difficulty prior to d/c.    Initial Impression / Assessment and Plan / ED Course  I have reviewed the triage vital signs and the nursing notes.  Pertinent labs that were available during my care of the patient were reviewed by me and considered in my medical decision making (see chart for details).  Patient with symptoms consistent with viral gastroenteritis.  Vitals are stable, low-grade fever.  Signs of dehydration, IV  fluids administered.  Lungs are clear. Due to patient's presentation and physical exam a chest x-ray was not ordered. Patient will be discharged with instructions to orally hydrate, rest, and use over-the-counter medications such as anti-inflammatories for muscle aches and Tylenol for fever. Rx for Zofran for nausea. Clear liquids for the next 24 hours and then advance to [employer] diet.   Final Clinical Impressions(s) / ED Diagnoses   Final diagnoses:  URI, acute  Nausea vomiting and diarrhea    New Prescriptions Discharge Medication List as of 09/01/2016  8:05 PM    START taking these medications   Details  ondansetron (ZOFRAN) 4 MG tablet Take 1 tablet (4 mg total) by mouth every 8 (eight) hours as needed for nausea or vomiting., Starting Fri 09/01/2016, Print       I personally performed the services described in this documentation, which was scribed in my presence. The recorded information has been reviewed and is accurate.     [address] Lincoln[address] Adisen Bennion, [place] 09/03/16 [phone]  Lorre Nick, MD 09/04/16 0030

## 2016-10-31 IMAGING — CR DG HIP (WITH OR WITHOUT PELVIS) 2-3V*R*
3 series · 3 of 3 positions shown · non-contrast
Comparison: None.

CLINICAL DATA: RIGHT hip pain for 3 days.  No known new injury.

EXAM:
DG HIP (WITH OR WITHOUT PELVIS) 2-3V RIGHT

[pelvis ap]
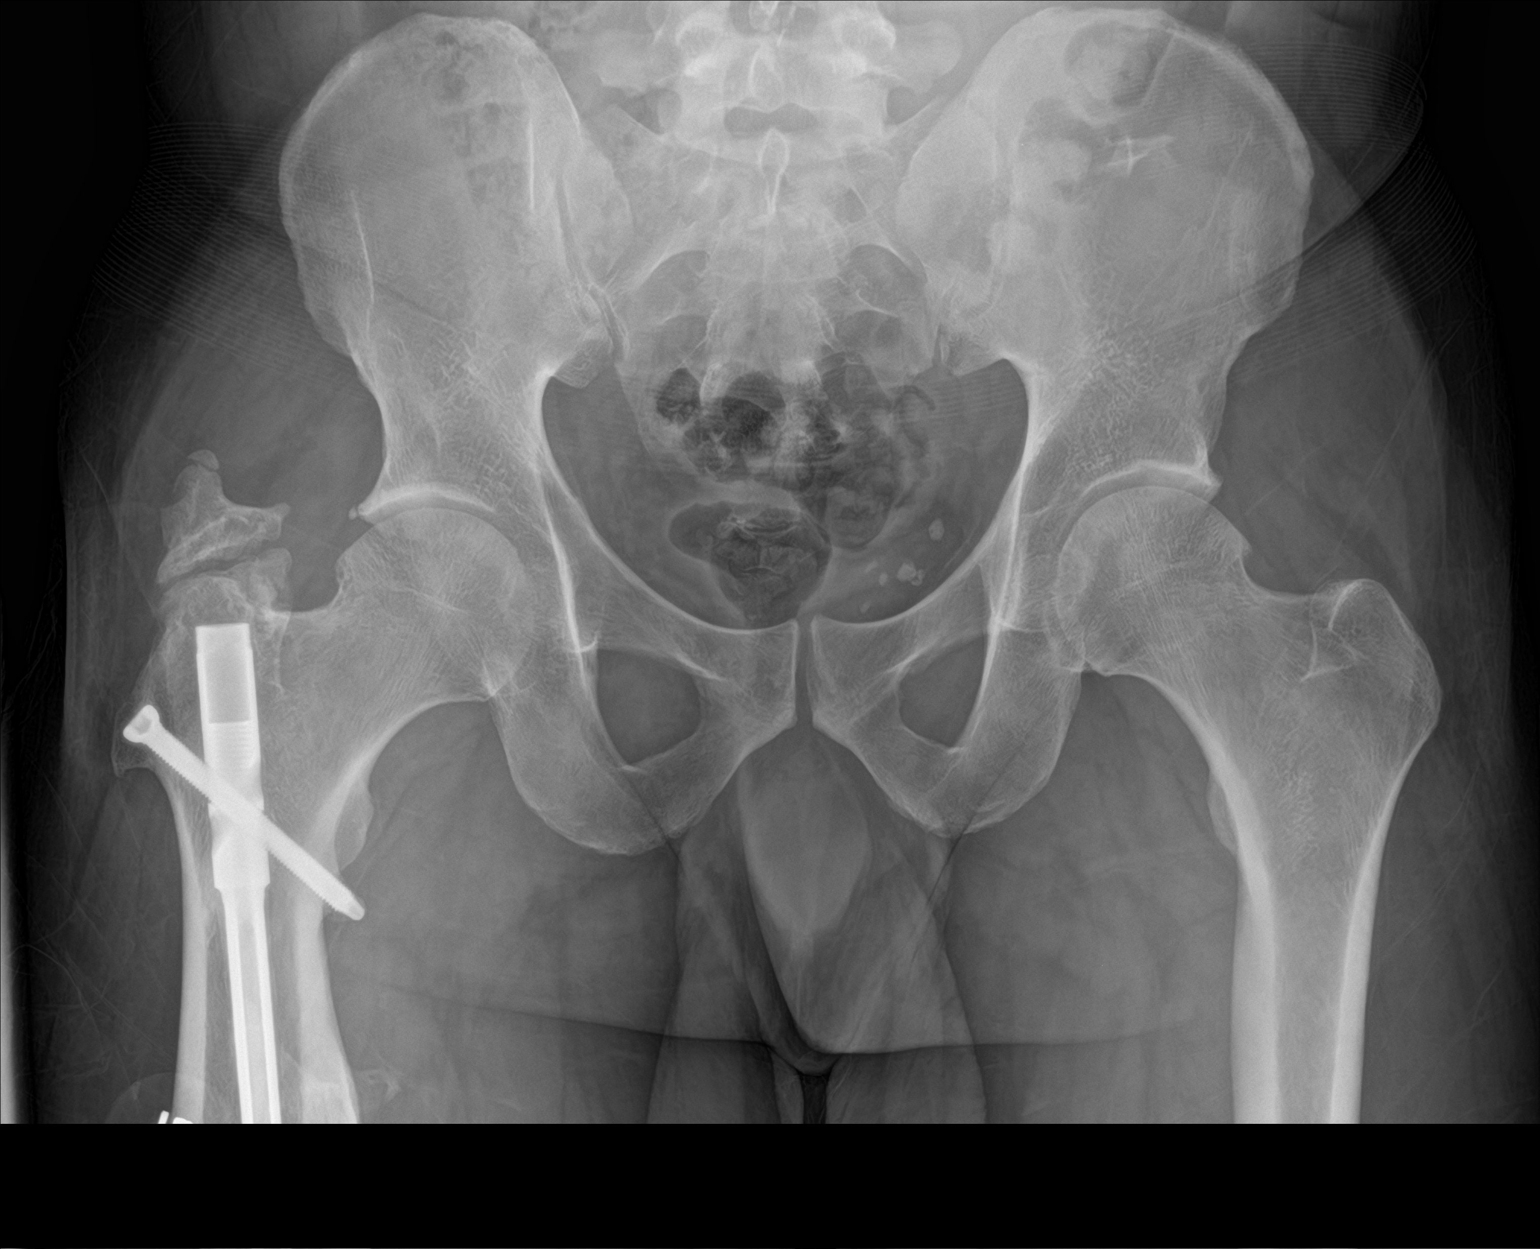

[hip ap]
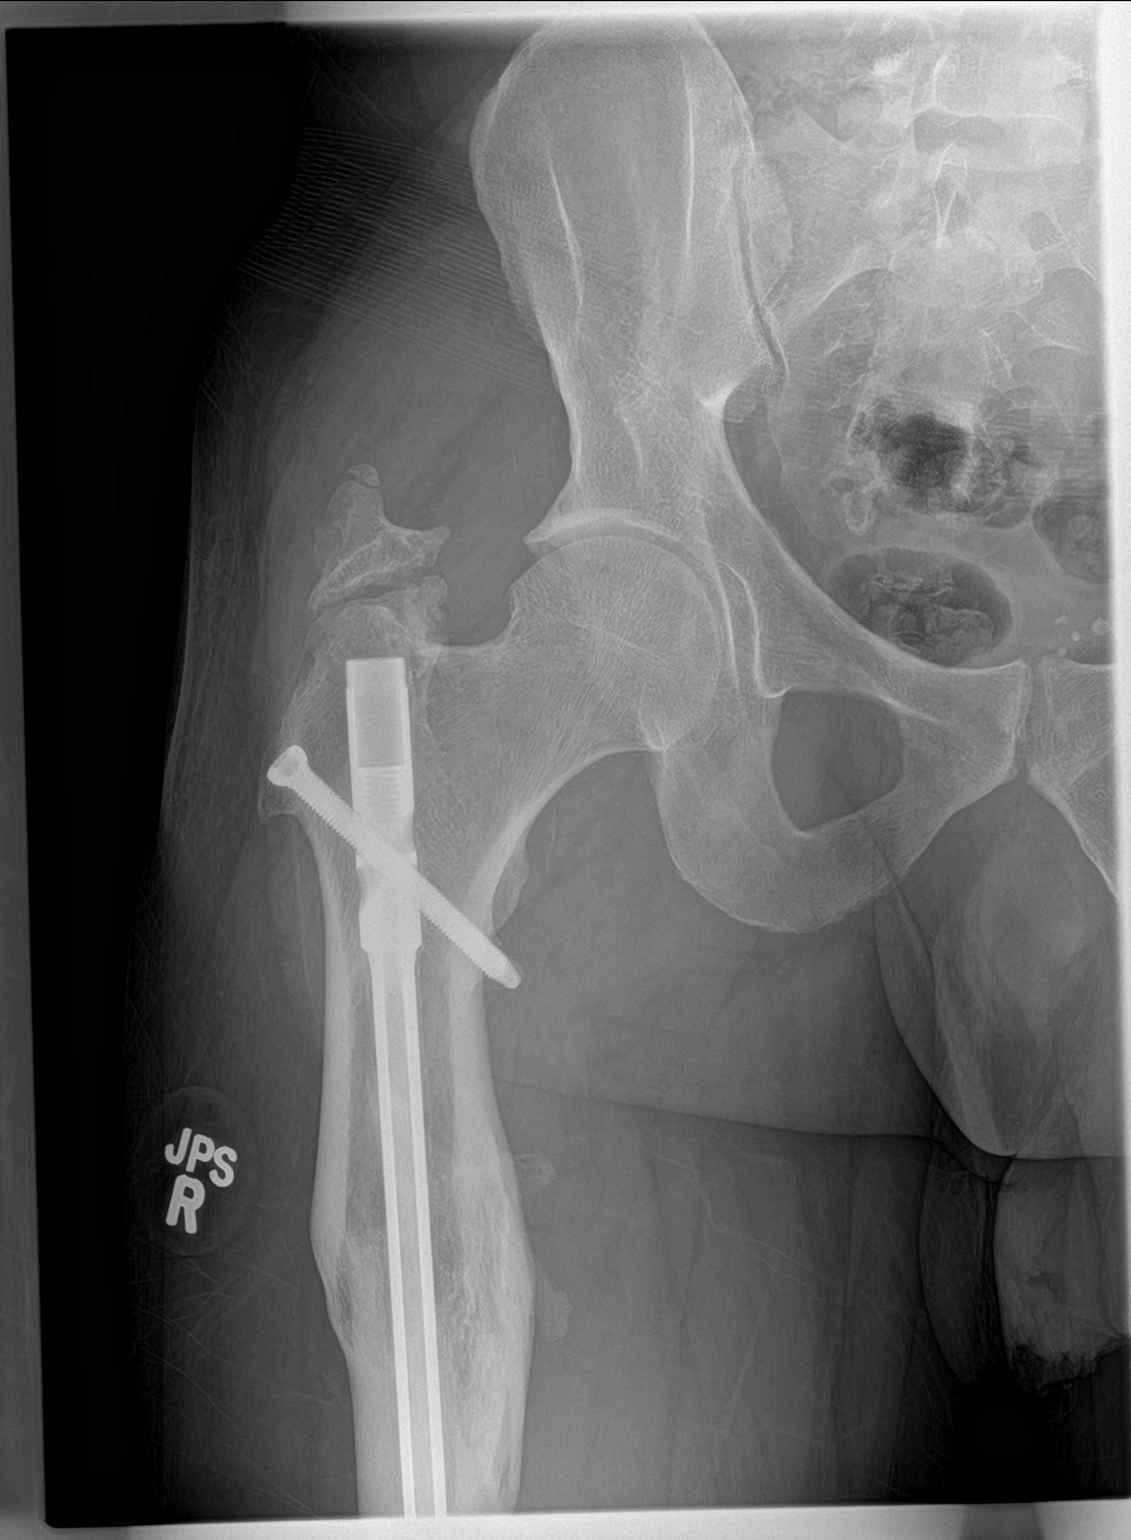

[hip lat]
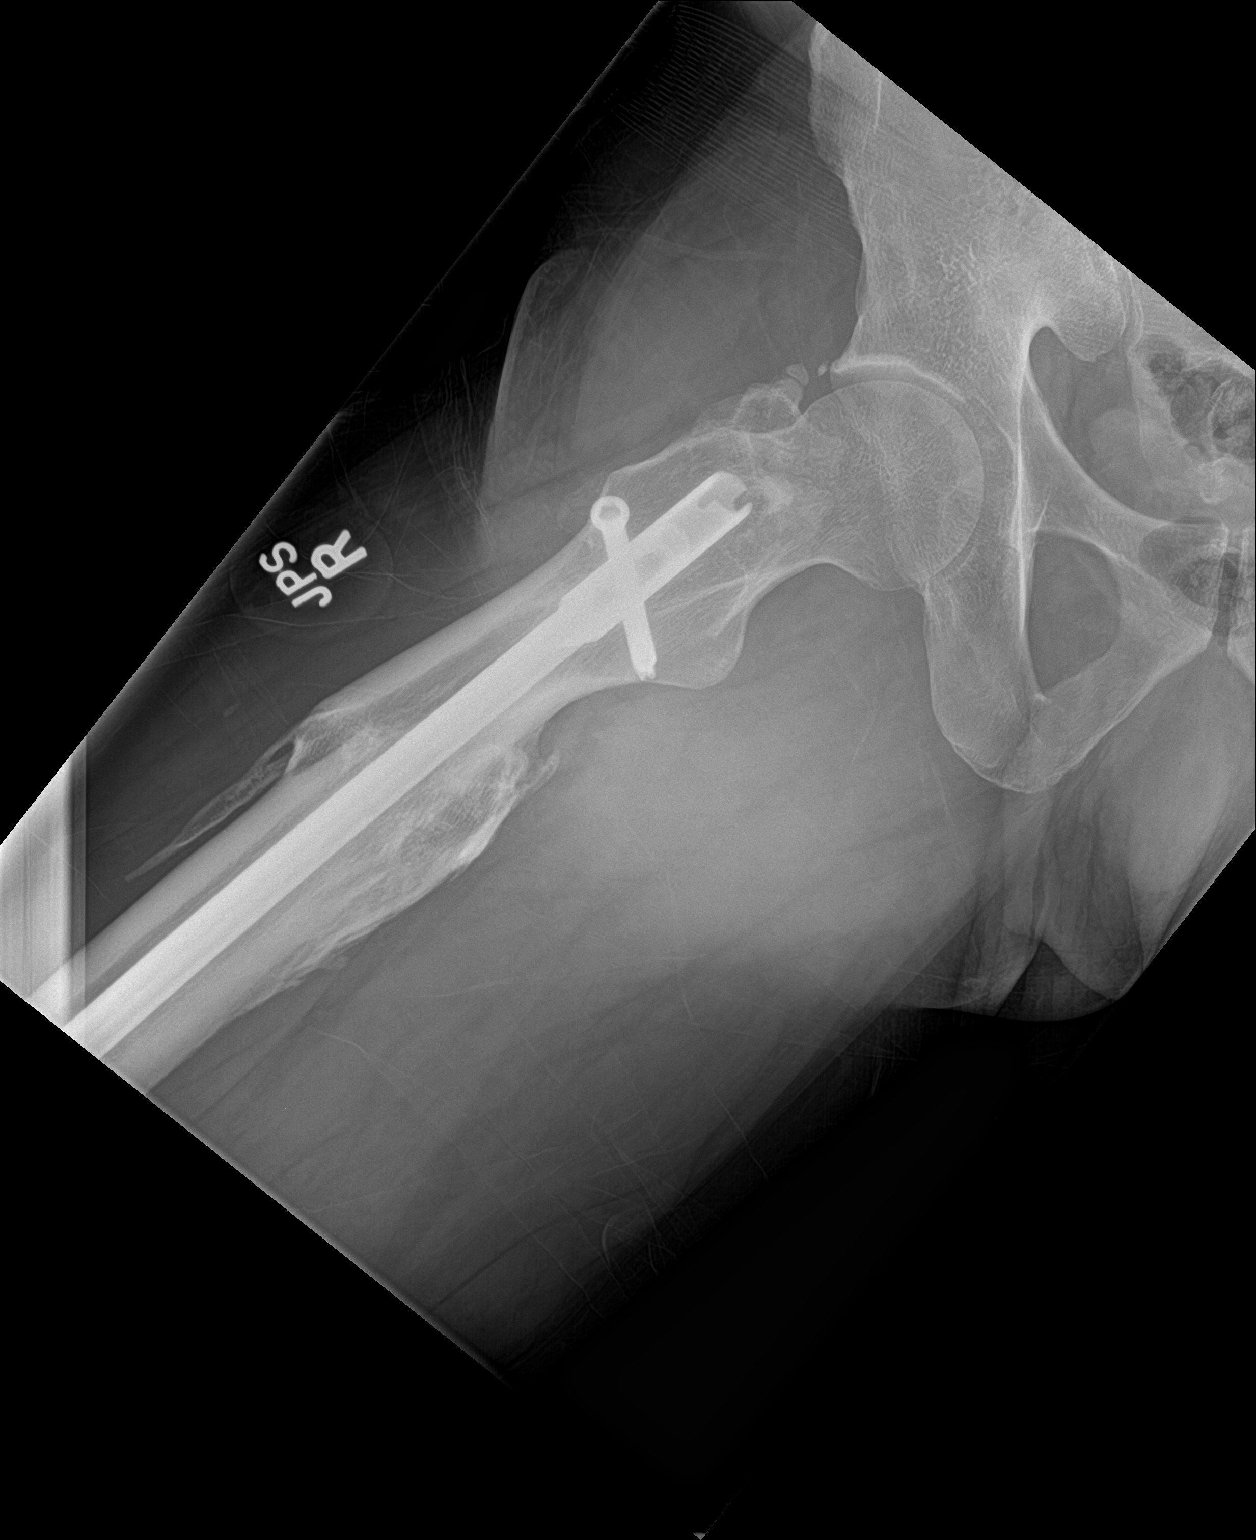

[3 of 3 positions shown; findings below may reference images not displayed]

FINDINGS: Intramedullary rod has been placed across a proximal RIGHT femur
fracture which is healed. There is proliferative change superior to
the RIGHT greater trochanter. No acute abnormality is detected.
IMPRESSION: Status post ORIF RIGHT femur fracture.  No acute findings.

## 2017-07-19 ENCOUNTER — Encounter (HOSPITAL_COMMUNITY): Payer: Self-pay | Admitting: Emergency Medicine

## 2017-07-19 ENCOUNTER — Ambulatory Visit (HOSPITAL_COMMUNITY)
Admission: EM | Admit: 2017-07-19 | Discharge: 2017-07-19 | Disposition: A | Discharge status: Home / Self Care | Payer: Self-pay | Attending: Family Medicine | Admitting: Family Medicine

## 2017-07-19 DIAGNOSIS — K047 Periapical abscess without sinus: Secondary | ICD-10-CM

## 2017-07-19 MED ORDER — AMOXICILLIN-POT CLAVULANATE 875-125 MG PO TABS: 1.0000 | ORAL_TABLET | Freq: Two times a day (BID) | ORAL | 0 refills | Status: AC

## 2017-07-19 MED ORDER — NAPROXEN 500 MG PO TABS: 500.0000 mg | ORAL_TABLET | Freq: Two times a day (BID) | ORAL | 0 refills | Status: AC

## 2017-07-19 NOTE — ED Provider Notes (Signed)
[facility]    CSN: [id number] Arrival date & time: 07/19/17  1721     History   Chief Complaint Chief Complaint  Patient presents with  . Dental Pain    HPI Antonio Floyd is a 42 y.o. male.   42 yo male here for left sided jaw and ear pain that is worsened when he talks. He had difficulty sleeping last night due to the pain. Feels pain is worse this morning. Has taken ibuprofen with a little relief. Says he thinks it is his tooth. Denies fever but admits to headache.      Past Medical History:  Diagnosis Date  . Bipolar 1 disorder (HCC)   . Schizophrenia [facility](HCC)     Patient Active Problem List   Diagnosis Date Noted  . Substance induced mood disorder (HCC) 04/23/2014  . Alcohol abuse with intoxication, uncomplicated (HCC) 04/23/2014  . Bipolar mood disorder (HCC) 05/22/2013    Past Surgical History:  Procedure Laterality Date  . APPENDECTOMY    . PARTIAL HIP ARTHROPLASTY         Home Medications    Prior to Admission medications   Medication Sig Start Date End Date Taking? Authorizing Provider  amoxicillin-clavulanate (AUGMENTIN) 875-125 MG tablet Take 1 tablet by mouth 2 (two) times daily for 7 days. 07/19/17 07/26/17  Aarianna Hoadley, DO  HYDROcodone-acetaminophen (NORCO/VICODIN) 5-325 MG tablet Take 1-2 tablets by mouth every 6 hours as needed for pain and/or cough. 12/29/15   Pisciotta, Joni ReiningNicole, PA-C  naproxen (NAPROSYN) 500 MG tablet Take 1 tablet (500 mg total) by mouth 2 (two) times daily with a meal. 07/19/17   Amias Hutchinson, [facility], DO  ondansetron (ZOFRAN) 4 MG tablet Take 1 tablet (4 mg total) by mouth every 8 (eight) hours as needed for nausea or vomiting. 09/01/16   Janne NapoleonNeese, Hope M, NP  QUEtiapine (SEROQUEL XR) 400 MG 24 hr tablet Take 400 mg by mouth at bedtime.    [provider]    Family History No family history on file.  Social History Social History   Tobacco Use  . Smoking status: Current Every Day Smoker    Packs/day: 0.50   Types: Cigarettes  . Smokeless tobacco: Never Used  Substance Use Topics  . Alcohol use: Yes  . Drug use: Yes    Types: Marijuana    Comment: denies     Allergies   Patient has no known allergies.   Review of Systems Review of Systems  Constitutional: Negative for chills and fever.  HENT: Positive for ear pain. Negative for congestion.   Eyes: Negative for discharge and itching.  Respiratory: Negative for apnea and chest tightness.   Cardiovascular: Negative for chest pain and leg swelling.  Gastrointestinal: Negative for abdominal distention and abdominal pain.  Endocrine: Negative for cold intolerance and heat intolerance.  Genitourinary: Negative for difficulty urinating and dysuria.  Musculoskeletal: Negative for arthralgias and back pain.  Neurological: Positive for headaches. Negative for dizziness.  Hematological: Negative for adenopathy. Does not bruise/bleed easily.     Physical Exam Triage Vital Signs ED Triage Vitals  Enc Vitals Group     BP 07/19/17 1804 132/76     Pulse Rate 07/19/17 1804 81     Resp 07/19/17 1804 16     Temp 07/19/17 1804 98.2 F (36.8 C)     Temp Source 07/19/17 1804 Temporal     SpO2 07/19/17 1804 100 %     Weight 07/19/17 1803 180 lb (81.6 kg)  Height 07/19/17 1803 5\' 9"  (1.753 m)     Head Circumference --      Peak Flow --      Pain Score 07/19/17 1803 10     Pain Loc --      Pain Edu? --      Excl. in GC? --    No data found.  Updated Vital Signs BP 132/76   Pulse 81   Temp 98.2 F (36.8 C) (Temporal)   Resp 16   Ht 5\' 9"  (1.753 m)   Wt 180 lb (81.6 kg)   SpO2 100%   BMI 26.58 kg/m   Visual Acuity Right Eye Distance:   Left Eye Distance:   Bilateral Distance:    Right Eye Near:   Left Eye Near:    Bilateral Near:     Physical Exam  Constitutional: He is oriented to person, place, and time. He appears well-developed and well-nourished.  HENT:  Head: Normocephalic and atraumatic.  Right Ear: External ear  normal.  Left Ear: External ear normal.  Overall good dental health. There is left molar that appears to be infected with cavities. Tenderness to palpation along left jaw and left TMJ  Eyes: EOM are normal. Pupils are equal, round, and reactive to light.  Neck: Normal range of motion. Neck supple.  Cardiovascular: Normal rate and intact distal pulses.  Pulmonary/Chest: Effort normal. No respiratory distress.  Musculoskeletal: Normal range of motion. He exhibits no edema.  Neurological: He is alert and oriented to person, place, and time.  Skin: Skin is warm and dry.  Psychiatric:  Poor judgment. He has unusual behavior, rocking and moving around the room while talking     UC Treatments / Results  Labs (all labs ordered are listed, but only abnormal results are displayed) Labs Reviewed - No data to display  EKG  EKG Interpretation None       Radiology No results found.  Procedures Procedures (including critical care time)  Medications Ordered in UC Medications - No data to display   Initial Impression / Assessment and Plan / UC Course  I have reviewed the triage vital signs and the nursing notes.  Pertinent labs & imaging results that were available during my care of the patient were reviewed by me and considered in my medical decision making (see chart for details).   Dental abscess versus TMJ dysfunction. Will treat with antibiotics and pain medicine. Follow up with dentist.  Final Clinical Impressions(s) / UC Diagnoses   Final diagnoses:  Dental abscess    ED Discharge Orders        Ordered    amoxicillin-clavulanate (AUGMENTIN) 875-125 MG tablet  2 times daily     07/19/17 1859    naproxen (NAPROSYN) 500 MG tablet  2 times daily with meals     07/19/17 1859       Controlled Substance Prescriptions [place] Controlled Substance Registry consulted? Not Applicable   Rolm Bookbinder, DO 07/19/17 1859

## 2017-07-19 NOTE — ED Triage Notes (Signed)
PT reports severe pain in left lower jaw. PT reports pain radiates to ear and face.

## 2018-01-27 ENCOUNTER — Encounter (HOSPITAL_COMMUNITY): Payer: Self-pay | Admitting: Emergency Medicine

## 2018-01-27 ENCOUNTER — Emergency Department (HOSPITAL_COMMUNITY)
Admission: EM | Admit: 2018-01-27 | Discharge: 2018-01-27 | Disposition: A | Discharge status: Home / Self Care | Payer: Self-pay | Attending: Emergency Medicine | Admitting: Emergency Medicine

## 2018-01-27 ENCOUNTER — Other Ambulatory Visit: Payer: Self-pay

## 2018-01-27 DIAGNOSIS — Z79899 Other long term (current) drug therapy: Secondary | ICD-10-CM | POA: Insufficient documentation

## 2018-01-27 DIAGNOSIS — Q846 Other congenital malformations of nails: Secondary | ICD-10-CM | POA: Insufficient documentation

## 2018-01-27 DIAGNOSIS — F17210 Nicotine dependence, cigarettes, uncomplicated: Secondary | ICD-10-CM | POA: Insufficient documentation

## 2018-01-27 MED ORDER — BUPIVACAINE HCL (PF) 0.5 % IJ SOLN
10.0000 mL | Freq: Once | INTRAMUSCULAR | Status: AC
  Administered 2018-01-27: 10 mL
  Filled 2018-01-27: qty 30

## 2018-01-27 MED ORDER — SULFAMETHOXAZOLE-TRIMETHOPRIM 800-160 MG PO TABS: 1.0000 | ORAL_TABLET | Freq: Two times a day (BID) | ORAL | 0 refills | Status: AC

## 2018-01-27 NOTE — ED Provider Notes (Addendum)
[facility]-EMERGENCY DEPT Provider Note   CSN: [id number] Arrival date & time: 01/27/18  1632     History   Chief Complaint No chief complaint on file.   HPI Antonio Floyd is a 42 y.o. male.  Patient presents with complaint of swelling to the left thumb over the past 4 days.  Patient has tried to drain the area at home without success.  No fevers, nausea, or vomiting.  He has difficulty bending his thumb due to the swelling.  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 The onset of this condition was acute. The course is constant. Aggravating factors: none. Alleviating factors: none.       Past Medical History:  Diagnosis Date  . Bipolar 1 disorder (HCC)   . Schizophrenia [facility])     Patient Active Problem List   Diagnosis Date Noted  . Substance induced mood disorder (HCC) 04/23/2014  . Alcohol abuse with intoxication, uncomplicated (HCC) 04/23/2014  . Bipolar mood disorder (HCC) 05/22/2013    Past Surgical History:  Procedure Laterality Date  . APPENDECTOMY    . PARTIAL HIP ARTHROPLASTY          Home Medications    Prior to Admission medications   Medication Sig Start Date End Date Taking? Authorizing Provider  HYDROcodone-acetaminophen (NORCO/VICODIN) 5-325 MG tablet Take 1-2 tablets by mouth every 6 hours as needed for pain and/or cough. 12/29/15   Pisciotta, Joni Reining, PA-C  naproxen (NAPROSYN) 500 MG tablet Take 1 tablet (500 mg total) by mouth 2 (two) times daily with a meal. 07/19/17   Moss, [facility], DO  ondansetron (ZOFRAN) 4 MG tablet Take 1 tablet (4 mg total) by mouth every 8 (eight) hours as needed for nausea or vomiting. 09/01/16   Janne Napoleon, NP  QUEtiapine (SEROQUEL XR) 400 MG 24 hr tablet Take 400 mg by mouth at bedtime.    [provider]    Family History Family History  Problem Relation Age of Onset  . Hypertension Mother   .  Hypertension Father     Social History Social History   Tobacco Use  . Smoking status: Current Every Day Smoker    Packs/day: 0.50    Types: Cigarettes  . Smokeless tobacco: Never Used  Substance Use Topics  . Alcohol use: Yes    Comment: occ  . Drug use: Yes    Types: Marijuana    Comment: denies     Allergies   Patient has no known allergies.   Review of Systems Review of Systems  Constitutional: Negative for fever.  Gastrointestinal: Negative for nausea and vomiting.  Musculoskeletal: Negative for joint swelling.  Skin: Positive for color change.       Positive for abscess  Hematological: Negative for adenopathy.     Physical Exam Updated Vital Signs BP (!) 156/111 (BP Location: Left Arm)   Pulse 74   Temp 97.8 F (36.6 C) (Oral)   Resp 18   Wt 80.7 kg (178 lb)   SpO2 100%   BMI 26.29 kg/m   Physical Exam  Constitutional: He appears well-developed and well-nourished.  HENT:  Head: Normocephalic and atraumatic.  Eyes: Conjunctivae are normal.  Neck: Normal range of motion. Neck supple.  Pulmonary/Chest: No respiratory distress.  Musculoskeletal:  Patient with large paronychia/eponychia of the left thumb with swelling extending to the IP joint.  There is overlying erythema.  I  can passively extend and flex the finger without significant tenderness.  Neurological: He is alert.  Skin: Skin is warm and dry.  Psychiatric: He has a normal mood and affect.  Nursing note and vitals reviewed.    ED Treatments / Results  Labs (all labs ordered are listed, but only abnormal results are displayed) Labs Reviewed - No data to display  EKG None  Radiology No results found.  Procedures .Marland KitchenIncision and Drainage Date/Time: 01/27/2018 6:38 PM Performed by: Renne Crigler, PA-C Authorized by: Renne Crigler, PA-C   Consent:    Consent obtained:  Verbal   Consent given by:  Patient   Risks discussed:  Bleeding, incomplete drainage, infection and pain    Alternatives discussed:  No treatment Location:    Type:  Abscess   Size:  2cm   Location:  Upper extremity   Upper extremity location:  Hand   Hand location:  L hand Pre-procedure details:    Skin preparation:  Betadine Anesthesia (see MAR for exact dosages):    Anesthesia method:  Nerve block   Block location:  L thumb   Block needle gauge:  27 G   Block anesthetic:  Bupivacaine 0.5% w/o epi   Block technique:  3-sided ring block   Block injection procedure:  Anatomic landmarks identified, introduced needle, incremental injection, negative aspiration for blood and anatomic landmarks palpated   Block outcome:  Anesthesia achieved Procedure type:    Complexity:  Simple Procedure details:    Incision types:  Stab incision   Scalpel blade:  11   Wound management:  Probed and deloculated   Drainage:  Bloody and purulent   Drainage amount:  Copious   Wound treatment:  Wound left open   Packing materials:  None Post-procedure details:    Patient tolerance of procedure:  Tolerated well, no immediate complications   (including critical care time)  Medications Ordered in ED Medications  bupivacaine (MARCAINE) 0.5 % injection 10 mL (has no administration in time range)     Initial Impression / Assessment and Plan / ED Course  I have reviewed the triage vital signs and the nursing notes.  Pertinent labs & imaging results that were available during my care of the patient were reviewed by me and considered in my medical decision making (see chart for details).     Patient seen and examined.  Discussed the procedure for incision and drainage with the patient.  He agrees to proceed.  Will perform digital block.   Vital signs reviewed and are as follows: BP (!) 156/111 (BP Location: Left Arm)   Pulse 74   Temp 97.8 F (36.6 C) (Oral)   Resp 18   Wt 80.7 kg (178 lb)   SpO2 100%   BMI 26.29 kg/m   6:39 PM I&D performed with good results.  No complications.  The patient was  urged to return to the Emergency Department urgently with worsening pain, swelling, expanding erythema especially if it streaks away from the affected area, fever, or if they have any other concerns.   The patient was urged to return to the Emergency Department or go to their PCP in 48 hours for wound recheck if the area is not significantly improved.  The patient verbalized understanding and stated agreement with this plan.    Final Clinical Impressions(s) / ED Diagnoses   Final diagnoses:  Eponychia   Patient with eponychia requiring I&D in the emergency department.  Treatment as above without complication.  Given size of the  infection, patient will be prescribed antibiotics to cover staph infection.  No signs of joint infection or flexor tenosynovitis at this point.  ED Discharge Orders        Ordered    sulfamethoxazole-trimethoprim (BACTRIM DS,SEPTRA DS) 800-160 MG tablet  2 times daily     01/27/18 1837       Renne CriglerGeiple, Tenae Graziosi, PA-C 01/27/18 1840    Renne CriglerGeiple, Qunisha Bryk, PA-C 01/27/18 1906    Gerhard MunchLockwood, Robert, MD 01/27/18 [phone]

## 2018-01-27 NOTE — Discharge Instructions (Signed)
Please read and follow all provided instructions.  Your diagnoses today include:  1. Eponychia     Tests performed today include:  Vital signs. See below for your results today.   Medications prescribed:   Bactrim (trimethoprim/sulfamethoxazole) - antibiotic  You have been prescribed an antibiotic medicine: take the entire course of medicine even if you are feeling better. Stopping early can cause the antibiotic not to work.  Take any prescribed medications only as directed.   Home care instructions:   Follow any educational materials contained in this packet  Follow-up instructions: Return to the Emergency Department in 48 hours for a recheck if your symptoms are not significantly improved.  Please follow-up with your primary care provider in the next 1 week for further evaluation of your symptoms.   Return instructions:  Return to the Emergency Department if you have:  Fever  Worsening symptoms  Worsening pain  Worsening swelling  Redness of the skin that moves away from the affected area, especially if it streaks away from the affected area   Any other emergent concerns  Your vital signs today were: BP (!) 156/111 (BP Location: Left Arm)    Pulse 74    Temp 97.8 F (36.6 C) (Oral)    Resp 18    Wt 80.7 kg (178 lb)    SpO2 100%    BMI 26.29 kg/m  If your blood pressure (BP) was elevated above 135/85 this visit, please have this repeated by your doctor within one month. --------------
